# Patient Record
Sex: Male | Born: 1990
Health system: Southern US, Community
[De-identification: ages and names within clinical notes are randomized; demographics above are authoritative.]

## PROBLEM LIST (undated history)

## (undated) DIAGNOSIS — F419 Anxiety disorder, unspecified: Secondary | ICD-10-CM

## (undated) DIAGNOSIS — E785 Hyperlipidemia, unspecified: Secondary | ICD-10-CM

## (undated) HISTORY — PX: OTHER SURGICAL HISTORY: SHX169

## (undated) HISTORY — DX: Hyperlipidemia, unspecified: E78.5

## (undated) HISTORY — DX: Anxiety disorder, unspecified: F41.9

---

## 2016-09-10 ENCOUNTER — Encounter: Payer: Self-pay | Admitting: Family Medicine

## 2018-04-26 ENCOUNTER — Encounter: Payer: Self-pay | Admitting: Family Medicine

## 2019-04-06 ENCOUNTER — Ambulatory Visit: Payer: BC Managed Care – PPO | Admitting: Family Medicine

## 2019-04-06 ENCOUNTER — Telehealth: Payer: Self-pay | Admitting: Family Medicine

## 2019-04-06 ENCOUNTER — Encounter: Payer: Self-pay | Admitting: Family Medicine

## 2019-04-06 ENCOUNTER — Other Ambulatory Visit: Payer: Self-pay

## 2019-04-06 VITALS — BP 130/90 | HR 88 | Temp 96.7°F | Resp 12 | Ht 72.5 in | Wt 224.2 lb

## 2019-04-06 DIAGNOSIS — E538 Deficiency of other specified B group vitamins: Secondary | ICD-10-CM

## 2019-04-06 DIAGNOSIS — R5382 Chronic fatigue, unspecified: Secondary | ICD-10-CM

## 2019-04-06 DIAGNOSIS — E611 Iron deficiency: Secondary | ICD-10-CM | POA: Diagnosis not present

## 2019-04-06 DIAGNOSIS — R7989 Other specified abnormal findings of blood chemistry: Secondary | ICD-10-CM

## 2019-04-06 DIAGNOSIS — E559 Vitamin D deficiency, unspecified: Secondary | ICD-10-CM

## 2019-04-06 DIAGNOSIS — E785 Hyperlipidemia, unspecified: Secondary | ICD-10-CM

## 2019-04-06 DIAGNOSIS — R030 Elevated blood-pressure reading, without diagnosis of hypertension: Secondary | ICD-10-CM

## 2019-04-06 NOTE — Patient Instructions (Signed)
A few things to remember from today's visit:   Chronic fatigue  Iron deficiency  B12 deficiency  Low testosterone in male   Medication I asked her to discontinue today was aspirated. Rest of the medication we will reevaluate when to have labs done in 12/2018.  It is a common symptom associated with multiple factors: psychologic,medications, systemic illness, sleep disorders,infections, and unknown causes. Some work-up can be done to evaluate for common causes as thyroid disease,anemia,diabetes, or abnormalities in calcium,potassium,or sodium. Regular physical activity as tolerated and a healthy diet is usually might help and usually recommended for chronic fatigue.   Please be sure medication list is accurate. If a new problem present, please set up appointment sooner than planned today.

## 2019-04-06 NOTE — Telephone Encounter (Signed)
There are no lab orders.

## 2019-04-06 NOTE — Telephone Encounter (Addendum)
Relation to pt: self  Call back number: [phone]   Reason for call:  Patient had a New Patient appointment today and was inquiring about lab orders seeking to schedule appointment tomorrow, please advise.   Patient will have medical records fax to [phone] please note when received

## 2019-04-06 NOTE — Telephone Encounter (Signed)
Please advise 

## 2019-04-06 NOTE — Progress Notes (Signed)
HPI:   Dalton Rogers is a 28 y.o. male, who is here today to establish care.  Former PCP: Dr Christoper Fabian He moved from [place] in 01/2019. He lives with his wife and 8-9 months son.  Last preventive routine visit: 12/2018  Chronic medical problems: Vit D def,iron def,B12 def,fatigue,anxiety,hiatal hernia,and HLD among some.  HLD: He is on Atorvastatin 20 mg daily. Last FLP done in 12/2018. He also takes Aspirin 81 mg for primary care prevention.  BP mildly elevated today. No Hx of HTN. Denies severe/frequent headache, visual changes, chest pain, dyspnea, palpitation, focal weakness, or edema.  Significant FHx of CVD.  He has a coronary calcium score 0.  B12 deficiency,he takes B12 500 mcg daily. He has not noted numbness,tingling,weaknesss,or MS changes.  Vit D deficiency:He takes Vit D3 5000 U daily.  Anxiety, he is on Paxil 20 mg,which has helped with symptoms. He has taken medication for about 10 years. He denies depressed mood but he has sadness and crying spells when he tried to discontinue Paxil in 2015. He was following with psychiatrist annually.  Today he is c/o fatigue,hungry "all the time" and wt gain.  He has had fatigue since 2015. Reporting Hx of low testosterone. Denies ED or decreased libido.  He has not been treated.  Sleeps well in general, 7-8 hours. His son wakes up about 1-2 times thought the night.  No known Hx of OSA.  Concerns today: Medications refill.  Review of Systems  Constitutional: Negative for activity change, appetite change, fever and unexpected weight change.  HENT: Negative for nosebleeds, sore throat and trouble swallowing.   Eyes: Negative for pain and redness.  Respiratory: Negative for cough and wheezing.   Gastrointestinal: Negative for abdominal pain, nausea and vomiting.  Endocrine: Negative for cold intolerance, heat intolerance, polydipsia, polyphagia and polyuria.  Genitourinary: Negative for decreased urine  volume, dysuria and hematuria.  Musculoskeletal: Negative for arthralgias, gait problem and myalgias.  Skin: Negative for pallor and rash.  Allergic/Immunologic: Negative for environmental allergies.  Neurological: Negative for syncope and numbness.  Psychiatric/Behavioral: The patient is nervous/anxious.   Rest see pertinent positives and negatives per HPI.   Current Outpatient Medications on File Prior to Visit  Medication Sig Dispense Refill  . atorvastatin (LIPITOR) 20 MG tablet Take 20 mg by mouth daily.    . Cholecalciferol (VITAMIN D3) 125 MCG (5000 UT) CAPS Take by mouth.    . Fe Fum-FePoly-FA-Vit C-Vit B3 (INTEGRA F PO) Take 62.5 mg by mouth.    . Ferrous Sulfate Dried 45 MG TBCR Take by mouth every other day.    Marland Kitchen PARoxetine (PAXIL) 20 MG tablet Take 20 mg by mouth daily.    . vitamin B-12 (CYANOCOBALAMIN) 500 MCG tablet Take 500 mcg by mouth daily.     No current facility-administered medications on file prior to visit.      Past Medical History:  Diagnosis Date  . Anxiety   . Hyperlipidemia    No Known Allergies  Family History  Problem Relation Age of Onset  . Heart disease Father   . Hyperlipidemia Father   . Hypertension Father     Social History   Socioeconomic History  . Marital status: Married    Spouse name: Not on file  . Number of children: Not on file  . Years of education: Not on file  . Highest education level: Not on file  Occupational History  . Not on file  Social Needs  . Financial  resource strain: Not on file  . Food insecurity    Worry: Not on file    Inability: Not on file  . Transportation needs    Medical: Not on file    Non-medical: Not on file  Tobacco Use  . Smoking status: Never Smoker  . Smokeless tobacco: Never Used  Substance and Sexual Activity  . Alcohol use: Yes    Comment: ocassionally  . Drug use: Not Currently  . Sexual activity: Yes  Lifestyle  . Physical activity    Days per week: Not on file    Minutes  per session: Not on file  . Stress: Not on file  Relationships  . Social Musician on phone: Not on file    Gets together: Not on file    Attends religious service: Not on file    Active member of club or organization: Not on file    Attends meetings of clubs or organizations: Not on file    Relationship status: Not on file  Other Topics Concern  . Not on file  Social History Narrative  . Not on file    Vitals:   04/06/19 0827  BP: 130/90  Pulse: 88  Resp: 12  Temp: (!) 96.7 F (35.9 C)  SpO2: 97%    Body mass index is 29.99 kg/m.   Physical Exam  Nursing note and vitals reviewed. Constitutional: He is oriented to person, place, and time. He appears well-developed. No distress.  HENT:  Head: Normocephalic and atraumatic.  Mouth/Throat: Oropharynx is clear and moist and mucous membranes are normal.  Eyes: Pupils are equal, round, and reactive to light. Conjunctivae are normal.  Neck: No tracheal deviation present. No thyromegaly present.  Cardiovascular: Normal rate and regular rhythm.  No murmur heard. Pulses:      Dorsalis pedis pulses are 2+ on the right side and 2+ on the left side.  Respiratory: Effort normal and breath sounds normal. No respiratory distress.  GI: Soft. He exhibits no mass. There is no hepatomegaly. There is no abdominal tenderness.  Musculoskeletal:        General: No edema.  Lymphadenopathy:    He has no cervical adenopathy.  Neurological: He is alert and oriented to person, place, and time. He has normal strength. No cranial nerve deficit. Gait normal.  Skin: Skin is warm. No rash noted. No erythema.  Psychiatric: He has a normal mood and affect.  Well groomed, good eye contact.    ASSESSMENT AND PLAN:  [name] was seen today for establish care.  Diagnoses and all orders for this visit:  Chronic fatigue We discussed possible etiologies: Systemic illness, immunologic,endocrinology,sleep disorder,  psychiatric/psychologic, infectious,medications side effects, and idiopathic. Examination today does not suggest a serious process. Healthy diet and regular physical activity may help.  -     TSH; Future  Iron deficiency According to pt,unknown etiology. Continue Iron supplementation.  -     TSH; Future -     CBC; Future -     Ferritin; Future  B12 deficiency Continue B12 500 mcg daily. B12 will be adjusted if necessary and according to B12 results.  -     CBC; Future -     Vitamin B12; Future  Low testosterone in male Not on treatment. Educated about criteria dx and treatment options.  -     Testosterone; Future  Elevated blood pressure reading Mildly elevated BP today. Instructed to monitor BP regularly.  Vitamin D deficiency, unspecified Continue Vit  D supplementation.  -     VITAMIN D 25 Hydroxy (Vit-D Deficiency, Fractures); Future  Hyperlipidemia, unspecified hyperlipidemia type Continue Atorvastatin 20 mg daily and low fat diet. Will obtain copy of FLP results.  At the time of the visit I do not have results of labs done in 12/2018. We decided to hold on blood work until I receive copy of lab results,so we can determine what we need to follow on. Apparently he asked for lab appt when checking out,so future labs placed.   Return in about 2 months (around 06/06/2019).    Chancey Ringel G. [place], MD  [facility]. [place] office.

## 2019-04-06 NOTE — Telephone Encounter (Signed)
Discussed about checking testosterone but I thought we agreed on waiting for results of labs done recently by his former PCP and then decide what to order. Thanks, BJ

## 2019-04-08 ENCOUNTER — Encounter: Payer: Self-pay | Admitting: *Deleted

## 2019-04-08 NOTE — Telephone Encounter (Signed)
Patient informed. 

## 2019-06-06 ENCOUNTER — Ambulatory Visit: Payer: BC Managed Care – PPO | Admitting: Family Medicine

## 2019-06-27 ENCOUNTER — Ambulatory Visit: Payer: BC Managed Care – PPO | Admitting: Family Medicine

## 2019-07-19 ENCOUNTER — Ambulatory Visit: Payer: BC Managed Care – PPO | Admitting: Family Medicine

## 2019-07-19 ENCOUNTER — Encounter: Payer: Self-pay | Admitting: Family Medicine

## 2019-07-19 ENCOUNTER — Other Ambulatory Visit: Payer: Self-pay

## 2019-07-19 VITALS — BP 128/80 | HR 84 | Temp 95.1°F | Resp 12 | Ht 72.5 in | Wt 218.0 lb

## 2019-07-19 DIAGNOSIS — E559 Vitamin D deficiency, unspecified: Secondary | ICD-10-CM | POA: Diagnosis not present

## 2019-07-19 DIAGNOSIS — E785 Hyperlipidemia, unspecified: Secondary | ICD-10-CM

## 2019-07-19 DIAGNOSIS — R5382 Chronic fatigue, unspecified: Secondary | ICD-10-CM

## 2019-07-19 DIAGNOSIS — Z23 Encounter for immunization: Secondary | ICD-10-CM | POA: Diagnosis not present

## 2019-07-19 DIAGNOSIS — E663 Overweight: Secondary | ICD-10-CM

## 2019-07-19 DIAGNOSIS — E611 Iron deficiency: Secondary | ICD-10-CM | POA: Diagnosis not present

## 2019-07-19 LAB — CBC
HCT: 44.9 % (ref 39.0–52.0)
Hemoglobin: 14.9 g/dL (ref 13.0–17.0)
MCHC: 33.3 g/dL (ref 30.0–36.0)
MCV: 91.7 fl (ref 78.0–100.0)
Platelets: 280 10*3/uL (ref 150.0–400.0)
RBC: 4.89 Mil/uL (ref 4.22–5.81)
RDW: 12.8 % (ref 11.5–15.5)
WBC: 6.6 10*3/uL (ref 4.0–10.5)

## 2019-07-19 LAB — LIPID PANEL
Cholesterol: 158 mg/dL (ref 0–200)
HDL: 39.8 mg/dL (ref >39.00)
LDL Cholesterol: 97 mg/dL (ref 0–99)
NonHDL: 118.04
Total CHOL/HDL Ratio: 4
Triglycerides: 105 mg/dL (ref 0.0–149.0)
VLDL: 21 mg/dL (ref 0.0–40.0)

## 2019-07-19 LAB — VITAMIN D 25 HYDROXY (VIT D DEFICIENCY, FRACTURES): VITD: 45.45 ng/mL (ref 30.00–100.00)

## 2019-07-19 LAB — IRON: Iron: 102 ug/dL (ref 42–165)

## 2019-07-19 LAB — FERRITIN: Ferritin: 42 ng/mL (ref 22.0–322.0)

## 2019-07-19 MED ORDER — ATORVASTATIN CALCIUM 20 MG PO TABS: 20.0000 mg | ORAL_TABLET | Freq: Every day | ORAL | 2 refills | Status: DC

## 2019-07-19 NOTE — Patient Instructions (Signed)
A few things to remember from today's visit:   Iron deficiency - Plan: Iron, Ferritin, CBC  Vitamin D deficiency, unspecified - Plan: VITAMIN D 25 Hydroxy (Vit-D Deficiency, Fractures)  Hyperlipidemia, unspecified hyperlipidemia type - Plan: Lipid panel  No changes today. Recommendations in regard to iron and vit D supplementation will be given according to lab results.  Please be sure medication list is accurate. If a new problem present, please set up appointment sooner than planned today.

## 2019-07-19 NOTE — Assessment & Plan Note (Signed)
Great improvement with dietary changes. No further work up at this time.

## 2019-07-19 NOTE — Assessment & Plan Note (Signed)
No changes in current management, will follow CBC/iron done today and will give further recommendations accordingly.

## 2019-07-19 NOTE — Assessment & Plan Note (Signed)
No changes in current management, will follow labs done today and will give further recommendations accordingly. We discussed benefits of stain meds as well as side effects.

## 2019-07-19 NOTE — Assessment & Plan Note (Signed)
He will try to be more compliant with taking Vit D 5000 U daily. Further recommendations will be given according to 25 [place] vit D results.

## 2019-07-19 NOTE — Assessment & Plan Note (Signed)
Since his last visit he has lost about 6 Lb since his last OV. Encouraged to follow a healthful diet. Regular physical activity recommended.

## 2019-07-19 NOTE — Progress Notes (Signed)
HPI:   DaltonDalton Rogers is a 28 y.o. male, who is here today for 4 months follow up.   He was last seen on 04/06/19.  Vit D deficiency: He is on Vit D 5000 U, he is not taking med daily.  HLD: He is on Atorvastatin 20 mg daily. Last FLP in 12/2018. Strong FHx of CVD.  Tolerating medication well. Denies chest pain, dyspnea, palpitation, claudication, focal weakness, or edema.  Fatigue has improved with dietary changes he made since his last visit. He is now vegan and feels more energetic. He is not exercising regularly.  Iron deficiency: He stopped one of his iron supplementation. Currently he is on Integra F PO once daily. He has not noted blood in stool or melena.  Review of Systems  Constitutional: Negative for activity change, appetite change, fatigue, fever and unexpected weight change.  HENT: Negative for nosebleeds, sore throat and trouble swallowing.   Eyes: Negative for redness.  Respiratory: Negative for cough and wheezing.   Gastrointestinal: Negative for abdominal pain, nausea and vomiting.  Genitourinary: Negative for decreased urine volume and hematuria.  Musculoskeletal: Negative for arthralgias and myalgias.  Neurological: Negative for syncope, weakness and headaches.  Rest of ROS, see pertinent positives sand negatives in HPI   Current Outpatient Medications on File Prior to Visit  Medication Sig Dispense Refill  . atorvastatin (LIPITOR) 20 MG tablet Take 20 mg by mouth daily.    . Cholecalciferol (VITAMIN D3) 125 MCG (5000 UT) CAPS Take by mouth.    . Fe Fum-FePoly-FA-Vit C-Vit B3 (INTEGRA F PO) Take 62.5 mg by mouth.    Marland Kitchen PARoxetine (PAXIL) 20 MG tablet Take 20 mg by mouth daily.    . vitamin B-12 (CYANOCOBALAMIN) 500 MCG tablet Take 500 mcg by mouth daily.     No current facility-administered medications on file prior to visit.      Past Medical History:  Diagnosis Date  . Anxiety   . Hyperlipidemia    No Known Allergies  Social History    Socioeconomic History  . Marital status: Married    Spouse name: Not on file  . Number of children: Not on file  . Years of education: Not on file  . Highest education level: Not on file  Occupational History  . Not on file  Social Needs  . Financial resource strain: Not on file  . Food insecurity    Worry: Not on file    Inability: Not on file  . Transportation needs    Medical: Not on file    Non-medical: Not on file  Tobacco Use  . Smoking status: Never Smoker  . Smokeless tobacco: Never Used  Substance and Sexual Activity  . Alcohol use: Yes    Comment: ocassionally  . Drug use: Not Currently  . Sexual activity: Yes  Lifestyle  . Physical activity    Days per week: Not on file    Minutes per session: Not on file  . Stress: Not on file  Relationships  . Social Musician on phone: Not on file    Gets together: Not on file    Attends religious service: Not on file    Active member of club or organization: Not on file    Attends meetings of clubs or organizations: Not on file    Relationship status: Not on file  Other Topics Concern  . Not on file  Social History Narrative  . Not on file  Vitals:   07/19/19 0753  BP: 128/80  Pulse: 84  Resp: 12  Temp: (!) 95.1 F (35.1 C)  SpO2: 97%   Body mass index is 29.16 kg/m. Wt Readings from Last 3 Encounters:  07/19/19 218 lb (98.9 kg)  04/06/19 224 lb 3.2 oz (101.7 kg)    Physical Exam  Nursing note and vitals reviewed. Constitutional: He is oriented to person, place, and time. He appears well-developed. No distress.  HENT:  Head: Normocephalic and atraumatic.  Mouth/Throat: Oropharynx is clear and moist and mucous membranes are normal.  Eyes: Pupils are equal, round, and reactive to light. Conjunctivae are normal.  Cardiovascular: Normal rate and regular rhythm.  No murmur heard. Pulses:      Dorsalis pedis pulses are 2+ on the right side and 2+ on the left side.  Respiratory: Effort  normal and breath sounds normal. No respiratory distress.  GI: Soft. He exhibits no mass. There is no hepatomegaly. There is no abdominal tenderness.  Musculoskeletal:        General: No edema.  Lymphadenopathy:    He has no cervical adenopathy.  Neurological: He is alert and oriented to person, place, and time. He has normal strength. No cranial nerve deficit. Gait normal.  Skin: Skin is warm. No rash noted. No erythema.  Psychiatric: He has a normal mood and affect.  Well groomed, good eye contact.   ASSESSMENT AND PLAN:   Dalton Rogers was seen today for 4 months follow-up.  Orders Placed This Encounter  Procedures  . Flu Vaccine QUAD 6+ mos PF IM (Fluarix Quad PF)  . VITAMIN D 25 Hydroxy (Vit-D Deficiency, Fractures)  . Iron  . Ferritin  . Lipid panel  . CBC   Lab Results  Component Value Date   CHOL 158 07/19/2019   HDL 39.80 07/19/2019   LDLCALC 97 07/19/2019   TRIG 105.0 07/19/2019   CHOLHDL 4 07/19/2019    Lab Results  Component Value Date   WBC 6.6 07/19/2019   HGB 14.9 07/19/2019   HCT 44.9 07/19/2019   MCV 91.7 07/19/2019   PLT 280.0 07/19/2019    Chronic fatigue Great improvement with dietary changes. No further work up at this time.   Hyperlipidemia, unspecified No changes in current management, will follow labs done today and will give further recommendations accordingly. We discussed benefits of stain meds as well as side effects.  Iron deficiency No changes in current management, will follow CBC/iron done today and will give further recommendations accordingly.   Overweight (BMI 25.0-29.9) Since his last visit he has lost about 6 Lb since his last OV. Encouraged to follow a healthful diet. Regular physical activity recommended.   Vitamin D deficiency, unspecified He will try to be more compliant with taking Vit D 5000 U daily. Further recommendations will be given according to 25 OH vit D results.  Need for influenza vaccination -  Flu Vaccine QUAD 6+ mos PF IM (Fluarix Quad PF)   Return in about 7 months (around 02/15/2020) for cpe.   Destane Speas G. [place], MD  [facility]. [place] office.

## 2019-07-20 MED ORDER — PAROXETINE HCL 20 MG PO TABS: 20.0000 mg | ORAL_TABLET | Freq: Every day | ORAL | 1 refills | Status: DC

## 2020-01-02 ENCOUNTER — Telehealth: Payer: Self-pay | Admitting: *Deleted

## 2020-01-02 NOTE — Telephone Encounter (Signed)
Patient requests refill of Folivane-F capsule

## 2020-01-03 NOTE — Telephone Encounter (Signed)
It is Ok to send refill. Thanks, BJ

## 2020-01-04 ENCOUNTER — Other Ambulatory Visit: Payer: Self-pay | Admitting: *Deleted

## 2020-01-04 MED ORDER — FOLIVANE-F 125-1 MG PO CAPS: 1.0000 | ORAL_CAPSULE | Freq: Every day | ORAL | 3 refills | Status: DC

## 2020-01-04 NOTE — Telephone Encounter (Signed)
Rx sent to the pharmacy.

## 2020-01-06 ENCOUNTER — Other Ambulatory Visit: Payer: Self-pay | Admitting: *Deleted

## 2020-01-06 MED ORDER — FOLIVANE-F 125-1 MG PO CAPS: 1.0000 | ORAL_CAPSULE | Freq: Every day | ORAL | 1 refills | Status: DC

## 2020-01-15 ENCOUNTER — Other Ambulatory Visit: Payer: Self-pay | Admitting: Family Medicine

## 2020-02-17 ENCOUNTER — Encounter: Payer: Self-pay | Admitting: Family Medicine

## 2020-04-18 ENCOUNTER — Other Ambulatory Visit: Payer: Self-pay | Admitting: Family Medicine

## 2020-04-18 DIAGNOSIS — E785 Hyperlipidemia, unspecified: Secondary | ICD-10-CM

## 2020-07-27 ENCOUNTER — Other Ambulatory Visit: Payer: Self-pay | Admitting: Family Medicine

## 2020-08-24 ENCOUNTER — Encounter: Payer: Self-pay | Admitting: Family Medicine

## 2020-08-24 ENCOUNTER — Telehealth (INDEPENDENT_AMBULATORY_CARE_PROVIDER_SITE_OTHER): Payer: No Typology Code available for payment source | Admitting: Family Medicine

## 2020-08-24 DIAGNOSIS — U071 COVID-19: Secondary | ICD-10-CM | POA: Insufficient documentation

## 2020-08-24 NOTE — Progress Notes (Signed)
   Subjective:    Patient ID: Dalton Rogers, male    DOB: Nov 23, 1990, 30 y.o.   MRN: [id number]  HPI Virtual Visit via Video Note  I connected with the patient on 08/24/20 at  3:30 PM EST by a video enabled telemedicine application and verified that I am speaking with the correct person using two identifiers.  Location patient: home Location provider:work or home office Persons participating in the virtual visit: patient, provider  I discussed the limitations of evaluation and management by telemedicine and the availability of in person appointments. The patient expressed understanding and agreed to proceed.   HPI: Here for a Covid-19 infection. He started feeling sick about 5 days ago. He has had chills, sweats (though no fever), body aches, and a dry cough. No NVD. His wifehas had similar symptoms. He tested positive for the Covid virus 2 days ago, while she has tested negative. Today he feels much better. He has been drinking fluids and taking Aleve.    ROS: See pertinent positives and negatives per HPI.  Past Medical History:  Diagnosis Date  . Anxiety   . Hyperlipidemia     History reviewed. No pertinent surgical history.  Family History  Problem Relation Age of Onset  . Heart disease Father   . Hyperlipidemia Father   . Hypertension Father      Current Outpatient Medications:  .  atorvastatin (LIPITOR) 20 MG tablet, TAKE 1 TABLET BY MOUTH EVERY DAY, Disp: 90 tablet, Rfl: 2 .  Cholecalciferol (VITAMIN D3) 125 MCG (5000 UT) CAPS, Take by mouth., Disp: , Rfl:  .  Fe Fum-FePoly-FA-Vit C-Vit B3 (FOLIVANE-F) 125-1 MG CAPS, Take 1 capsule by mouth daily., Disp: 90 capsule, Rfl: 1 .  PARoxetine (PAXIL) 20 MG tablet, TAKE 1 TABLET BY MOUTH EVERY DAY, Disp: 90 tablet, Rfl: 1 .  vitamin B-12 (CYANOCOBALAMIN) 500 MCG tablet, Take 500 mcg by mouth daily., Disp: , Rfl:   EXAM:  VITALS per patient if applicable:  GENERAL: alert, oriented, appears well and in no acute  distress  HEENT: atraumatic, conjunttiva clear, no obvious abnormalities on inspection of external nose and ears  NECK: normal movements of the head and neck  LUNGS: on inspection no signs of respiratory distress, breathing rate appears normal, no obvious gross SOB, gasping or wheezing  CV: no obvious cyanosis  MS: moves all visible extremities without noticeable abnormality  PSYCH/NEURO: pleasant and cooperative, no obvious depression or anxiety, speech and thought processing grossly intact  ASSESSMENT AND PLAN: Covid-19 infection. He will quarantine at home for a total of 10 days. He can take Delsym as needed for the cough. Follow up prn.  Gershon Crane, MD  Discussed the following assessment and plan:  No diagnosis found.     I discussed the assessment and treatment plan with the patient. The patient was provided an opportunity to ask questions and all were answered. The patient agreed with the plan and demonstrated an understanding of the instructions.   The patient was advised to call back or seek an in-person evaluation if the symptoms worsen or if the condition fails to improve as anticipated.     Review of Systems     Objective:   Physical Exam        Assessment & Plan:

## 2020-10-10 ENCOUNTER — Telehealth (INDEPENDENT_AMBULATORY_CARE_PROVIDER_SITE_OTHER): Payer: No Typology Code available for payment source | Admitting: Family Medicine

## 2020-10-10 ENCOUNTER — Encounter: Payer: Self-pay | Admitting: Family Medicine

## 2020-10-10 VITALS — Ht 72.5 in

## 2020-10-10 DIAGNOSIS — E785 Hyperlipidemia, unspecified: Secondary | ICD-10-CM

## 2020-10-10 DIAGNOSIS — E611 Iron deficiency: Secondary | ICD-10-CM

## 2020-10-10 DIAGNOSIS — R002 Palpitations: Secondary | ICD-10-CM | POA: Diagnosis not present

## 2020-10-10 DIAGNOSIS — Z1159 Encounter for screening for other viral diseases: Secondary | ICD-10-CM | POA: Diagnosis not present

## 2020-10-10 DIAGNOSIS — F419 Anxiety disorder, unspecified: Secondary | ICD-10-CM

## 2020-10-10 NOTE — Progress Notes (Signed)
Virtual Visit via Video Note I connected with Dalton Rogers on 10/10/20 by a video enabled telemedicine application and verified that I am speaking with the correct person using two identifiers.  Location patient: home Location provider:work office Persons participating in the virtual visit: patient, provider  I discussed the limitations of evaluation and management by telemedicine and the availability of in person appointments. The patient expressed understanding and agreed to proceed.  Chief Complaint  Patient presents with  . Palpitations   HPI: Dalton Rogers is a 30 yo male with hx of HLD, anxiety,and iron ef anemia c/o 7 days of intermittent episodes of palpitations. This is a new problem. It happens most of the times when he is resting, sometimes when walking. Problem does not wake him up/it does not interfere with sleep.  No associated fever,abnormal wt loss,CP,SOB,abdominal pain,N/V,dizziness,syncope,or diaphoresis. Episodes happened every 5 minutes, for the past 24 hours it is every 10 min. Each episode last 1-2 seconds.  He has checked HR during episodes, usually 70-90/min, regular. He has not identified exacerbating, although it seems to be worse with caffeine intake. He has not identified alleviating factors.  No new medications or stressors. Anxiety has been stable. He is on Paroxetine 20 mg daily. Negative for depression.  HLD: He is on Atorvastatin 20 mg daily. He has tolerated medication well.  Lab Results  Component Value Date   CHOL 158 07/19/2019   HDL 39.80 07/19/2019   LDLCALC 97 07/19/2019   TRIG 105.0 07/19/2019   CHOLHDL 4 07/19/2019   Iron deficiency: He stopped iron supplementation a few days ago. He has not noted melena or blood in stool.  Lab Results  Component Value Date   WBC 6.6 07/19/2019   HGB 14.9 07/19/2019   HCT 44.9 07/19/2019   MCV 91.7 07/19/2019   PLT 280.0 07/19/2019   ROS: See pertinent positives and negatives per HPI.  Past  Medical History:  Diagnosis Date  . Anxiety   . Hyperlipidemia    History reviewed. No pertinent surgical history.  Family History  Problem Relation Age of Onset  . Heart disease Father   . Hyperlipidemia Father   . Hypertension Father     Social History   Socioeconomic History  . Marital status: Married    Spouse name: Not on file  . Number of children: Not on file  . Years of education: Not on file  . Highest education level: Not on file  Occupational History  . Not on file  Tobacco Use  . Smoking status: Never Smoker  . Smokeless tobacco: Never Used  Substance and Sexual Activity  . Alcohol use: Yes    Comment: ocassionally  . Drug use: Not Currently  . Sexual activity: Yes  Other Topics Concern  . Not on file  Social History Narrative  . Not on file   Social Determinants of Health   Financial Resource Strain: Not on file  Food Insecurity: Not on file  Transportation Needs: Not on file  Physical Activity: Not on file  Stress: Not on file  Social Connections: Not on file  Intimate Partner Violence: Not on file    Current Outpatient Medications:  .  atorvastatin (LIPITOR) 20 MG tablet, TAKE 1 TABLET BY MOUTH EVERY DAY, Disp: 90 tablet, Rfl: 2 .  Cholecalciferol (VITAMIN D3) 125 MCG (5000 UT) CAPS, Take by mouth., Disp: , Rfl:  .  Fe Fum-FePoly-FA-Vit C-Vit B3 (FOLIVANE-F) 125-1 MG CAPS, Take 1 capsule by mouth daily., Disp: 90 capsule, Rfl: 1 .  PARoxetine (PAXIL) 20 MG tablet, TAKE 1 TABLET BY MOUTH EVERY DAY, Disp: 90 tablet, Rfl: 1 .  vitamin B-12 (CYANOCOBALAMIN) 500 MCG tablet, Take 500 mcg by mouth daily., Disp: , Rfl:   EXAM:  VITALS per patient if applicable:Ht 6' 0.5" (1.842 m)   BMI 29.16 kg/m   GENERAL: alert, oriented, appears well and in no acute distress  HEENT: atraumatic, conjunctiva clear, no obvious abnormalities on inspection.  NECK: normal movements of the head and neck  LUNGS: on inspection no signs of respiratory distress,  breathing rate appears normal, no obvious gross SOB, gasping or wheezing  CV: no obvious cyanosis  MS: moves all visible extremities without noticeable abnormality  PSYCH/NEURO: pleasant and cooperative, no obvious depression or anxiety, speech and thought processing grossly intact  ASSESSMENT AND PLAN:  Discussed the following assessment and plan:  Heart palpitations - Plan: CBC, TSH, EKG 12-Lead We discussed possible etiologies. Hx does not suggest a serious process. Recommend stopping caffeine for a few weeks and monitor for changes. Adequate hydration. Instructed about warning signs. Lab appt and EKG will be arranged.  Iron deficiency - Plan: CBC, Iron, Ferritin Planning on resuming iron supplementation. Further recommendations according to CBC and iron results.  Hyperlipidemia, unspecified hyperlipidemia type - Plan: Comprehensive metabolic panel, Lipid panel Continue Atorvastatin 20 mg daily.  Encounter for HCV screening test for low risk patient - Plan: Hepatitis C antibody  Anxiety disorder, unspecified type Problem seems to be well controlled. Continue Paroxetine 20 mg daily.  We discussed possible serious and likely etiologies, options for evaluation and workup, limitations of telemedicine visit vs in person visit, treatment, treatment risks and precautions.  I discussed the assessment and treatment plan with the patient. Dalton Rogers was provided an opportunity to ask questions and all were answered. He agreed with the plan and demonstrated an understanding of the instructions.  Return in about 5 days (around 10/15/2020) for labs and EKG.  Jaylaa Gallion [place], MD

## 2020-10-15 ENCOUNTER — Other Ambulatory Visit: Payer: Self-pay

## 2020-10-15 ENCOUNTER — Other Ambulatory Visit (INDEPENDENT_AMBULATORY_CARE_PROVIDER_SITE_OTHER): Payer: No Typology Code available for payment source

## 2020-10-15 DIAGNOSIS — E611 Iron deficiency: Secondary | ICD-10-CM | POA: Diagnosis not present

## 2020-10-15 DIAGNOSIS — E785 Hyperlipidemia, unspecified: Secondary | ICD-10-CM

## 2020-10-15 DIAGNOSIS — R002 Palpitations: Secondary | ICD-10-CM | POA: Diagnosis not present

## 2020-10-15 DIAGNOSIS — Z1159 Encounter for screening for other viral diseases: Secondary | ICD-10-CM | POA: Diagnosis not present

## 2020-10-15 LAB — CBC
HCT: 44 % (ref 39.0–52.0)
Hemoglobin: 15 g/dL (ref 13.0–17.0)
MCHC: 34 g/dL (ref 30.0–36.0)
MCV: 90.3 fl (ref 78.0–100.0)
Platelets: 293 10*3/uL (ref 150.0–400.0)
RBC: 4.87 Mil/uL (ref 4.22–5.81)
RDW: 13.2 % (ref 11.5–15.5)
WBC: 6 10*3/uL (ref 4.0–10.5)

## 2020-10-15 LAB — COMPREHENSIVE METABOLIC PANEL
ALT: 35 U/L (ref 0–53)
AST: 21 U/L (ref 0–37)
Albumin: 4.6 g/dL (ref 3.5–5.2)
Alkaline Phosphatase: 81 U/L (ref 39–117)
BUN: 10 mg/dL (ref 6–23)
CO2: 30 mEq/L (ref 19–32)
Calcium: 9.8 mg/dL (ref 8.4–10.5)
Chloride: 104 mEq/L (ref 96–112)
Creatinine, Ser: 0.99 mg/dL (ref 0.40–1.50)
GFR: 102.42 mL/min (ref >60.00)
Glucose, Bld: 90 mg/dL (ref 70–99)
Potassium: 4 mEq/L (ref 3.5–5.1)
Sodium: 141 mEq/L (ref 135–145)
Total Bilirubin: 0.4 mg/dL (ref 0.2–1.2)
Total Protein: 6.9 g/dL (ref 6.0–8.3)

## 2020-10-15 LAB — TSH: TSH: 1.97 u[IU]/mL (ref 0.35–4.50)

## 2020-10-15 LAB — LIPID PANEL
Cholesterol: 165 mg/dL (ref 0–200)
HDL: 44.5 mg/dL (ref >39.00)
LDL Cholesterol: 91 mg/dL (ref 0–99)
NonHDL: 120.5
Total CHOL/HDL Ratio: 4
Triglycerides: 146 mg/dL (ref 0.0–149.0)
VLDL: 29.2 mg/dL (ref 0.0–40.0)

## 2020-10-15 LAB — IRON: Iron: 95 ug/dL (ref 42–165)

## 2020-10-15 LAB — FERRITIN: Ferritin: 34.5 ng/mL (ref 22.0–322.0)

## 2020-10-16 LAB — HEPATITIS C ANTIBODY
Hepatitis C Ab: NONREACTIVE
SIGNAL TO CUT-OFF: 0.01 (ref <1.00)

## 2021-04-10 ENCOUNTER — Encounter: Payer: Self-pay | Admitting: Family Medicine

## 2021-04-10 DIAGNOSIS — E785 Hyperlipidemia, unspecified: Secondary | ICD-10-CM

## 2021-04-12 MED ORDER — ATORVASTATIN CALCIUM 20 MG PO TABS: 20.0000 mg | ORAL_TABLET | Freq: Every day | ORAL | 1 refills | Status: DC

## 2021-07-09 ENCOUNTER — Other Ambulatory Visit: Payer: Self-pay

## 2021-07-09 ENCOUNTER — Encounter (HOSPITAL_BASED_OUTPATIENT_CLINIC_OR_DEPARTMENT_OTHER): Payer: Self-pay

## 2021-07-09 DIAGNOSIS — Z5321 Procedure and treatment not carried out due to patient leaving prior to being seen by health care provider: Secondary | ICD-10-CM | POA: Insufficient documentation

## 2021-07-09 DIAGNOSIS — K219 Gastro-esophageal reflux disease without esophagitis: Secondary | ICD-10-CM | POA: Insufficient documentation

## 2021-07-09 DIAGNOSIS — R1110 Vomiting, unspecified: Secondary | ICD-10-CM | POA: Diagnosis present

## 2021-07-09 NOTE — ED Triage Notes (Signed)
Thinks he may a bolus of good in his esophagus. States he had chicken for dinner.

## 2021-07-10 ENCOUNTER — Emergency Department (HOSPITAL_BASED_OUTPATIENT_CLINIC_OR_DEPARTMENT_OTHER)
Admission: EM | Admit: 2021-07-10 | Discharge: 2021-07-10 | Disposition: A | Discharge status: Home / Self Care | Payer: No Typology Code available for payment source | Attending: Emergency Medicine | Admitting: Emergency Medicine

## 2021-07-10 NOTE — ED Notes (Signed)
RN out in lobby, pt says he is feeling better and is leaving. Advised he is free to return if he changes his mind at any time.

## 2021-07-11 ENCOUNTER — Encounter: Payer: Self-pay | Admitting: Family Medicine

## 2021-07-11 ENCOUNTER — Ambulatory Visit: Payer: No Typology Code available for payment source | Admitting: Family Medicine

## 2021-07-11 VITALS — BP 118/84 | HR 80 | Temp 98.4°F | Wt 219.6 lb

## 2021-07-11 DIAGNOSIS — R1310 Dysphagia, unspecified: Secondary | ICD-10-CM | POA: Diagnosis not present

## 2021-07-11 DIAGNOSIS — K449 Diaphragmatic hernia without obstruction or gangrene: Secondary | ICD-10-CM

## 2021-07-11 MED ORDER — OMEPRAZOLE 20 MG PO CPDR: 20.0000 mg | DELAYED_RELEASE_CAPSULE | Freq: Every day | ORAL | 0 refills | Status: DC

## 2021-07-11 NOTE — Progress Notes (Signed)
Subjective:    Patient ID: Dalton Rogers, male    DOB: [birth year], 30 y.o.   MRN: [id number]  Chief Complaint  Patient presents with   Hiatal Hernia    Could not get chicken down, went to ER but while there it went down and was able to swallow water so left     HPI Patient was seen today for ongoing concern.  Patient endorses dysphagia at times with solid foods.  Recent episode prompted patient to go to the emergency department after a piece of chicken got stuck in his esophagus causing emesis every 10 minutes.  Patient had emesis in the ED which may have dislodged the piece of chicken.  Patient left without being seen after an extended wait.  Patient notes incidental history of hiatal hernia noted on imaging several years ago.  Patient currently having dysphagia with solids at least once a week.  States it is affecting his quality of life.  Denies overt heartburn symptoms.  Eating smaller bites and thoroughly chewing food.  Past Medical History:  Diagnosis Date   Anxiety    Hyperlipidemia     No Known Allergies  ROS General: Denies fever, chills, night sweats, changes in weight, changes in appetite HEENT: Denies headaches, ear pain, changes in vision, rhinorrhea, sore throat CV: Denies CP, palpitations, SOB, orthopnea Pulm: Denies SOB, cough, wheezing GI: Denies abdominal pain, nausea, vomiting, diarrhea, constipation  + dysphagia with solids GU: Denies dysuria, hematuria, frequency Msk: Denies muscle cramps, joint pains Neuro: Denies weakness, numbness, tingling Skin: Denies rashes, bruising Psych: Denies depression, anxiety, hallucinations     Objective:    Blood pressure 118/84, pulse 80, temperature 98.4 F (36.9 C), temperature source Oral, weight 219 lb 9.6 oz (99.6 kg), SpO2 98 %.   Gen. Pleasant, well-nourished, in no distress, normal affect   HEENT: [place]/AT, face symmetric, conjunctiva clear, no scleral icterus, PERRLA, EOMI, nares patent without drainage Lungs: no  accessory muscle use Cardiovascular: RRR, no peripheral edema Musculoskeletal: No deformities, no cyanosis or clubbing, normal tone Neuro:  A&Ox3, CN II-XII intact, normal gait Skin:  Warm, no lesions/ rash   Wt Readings from Last 3 Encounters:  07/09/21 215 lb (97.5 kg)  07/19/19 218 lb (98.9 kg)  04/06/19 224 lb 3.2 oz (101.7 kg)    Lab Results  Component Value Date   WBC 6.0 10/15/2020   HGB 15.0 10/15/2020   HCT 44.0 10/15/2020   PLT 293.0 10/15/2020   GLUCOSE 90 10/15/2020   CHOL 165 10/15/2020   TRIG 146.0 10/15/2020   HDL 44.50 10/15/2020   LDLCALC 91 10/15/2020   ALT 35 10/15/2020   AST 21 10/15/2020   NA 141 10/15/2020   K 4.0 10/15/2020   CL 104 10/15/2020   CREATININE 0.99 10/15/2020   BUN 10 10/15/2020   CO2 30 10/15/2020   TSH 1.97 10/15/2020    Assessment/Plan:  Dysphagia, unspecified type  -Continue general precautions including taking small bites and taking time to chew food -discussed starting Prilosec 20 mg -Given handout - Plan: Ambulatory referral to Gastroenterology, omeprazole (PRILOSEC) 20 MG capsule  Hiatal hernia  -[place] -Given handout - Plan: Ambulatory referral to Gastroenterology, omeprazole (PRILOSEC) 20 MG capsule  F/u prn  Abbe Amsterdam, MD

## 2021-07-18 ENCOUNTER — Encounter: Payer: Self-pay | Admitting: Gastroenterology

## 2021-08-20 ENCOUNTER — Ambulatory Visit (INDEPENDENT_AMBULATORY_CARE_PROVIDER_SITE_OTHER): Payer: No Typology Code available for payment source | Admitting: Gastroenterology

## 2021-08-20 ENCOUNTER — Encounter: Payer: Self-pay | Admitting: Gastroenterology

## 2021-08-20 VITALS — BP 122/70 | HR 100 | Ht 72.0 in | Wt 221.0 lb

## 2021-08-20 DIAGNOSIS — R1319 Other dysphagia: Secondary | ICD-10-CM | POA: Diagnosis not present

## 2021-08-20 NOTE — Patient Instructions (Signed)
If you are age 32 or older, your body mass index should be between 23-30. Your Body mass index is 29.97 kg/m. If this is out of the aforementioned range listed, please consider follow up with your Primary Care Provider.  If you are age 30 or younger, your body mass index should be between 19-25. Your Body mass index is 29.97 kg/m. If this is out of the aformentioned range listed, please consider follow up with your Primary Care Provider.   You have been scheduled for an endoscopy. Please follow written instructions given to you at your visit today. If you use inhalers (even only as needed), please bring them with you on the day of your procedure.  The [facility] providers would like to encourage you to use [facility] to communicate with providers for non-urgent requests or questions.  Due to long hold times on the telephone, sending your provider a message by [facility] may be a faster and more efficient way to get a response.  Please allow 48 business hours for a response.  Please remember that this is for non-urgent requests.   It was a pleasure to see you today!  Thank you for trusting me with your gastrointestinal care!    Scott E.Tomasa Rand, MD

## 2021-08-20 NOTE — Progress Notes (Signed)
HPI : Dalton Rogers is a very pleasant 31 year old male referred to [place] by Dr. Betty [place] for further evaluation of dysphagia.  Patient has had occasional dysphagia for years, but recently the symptoms have worsened.  He went to the ED Nov 30th after getting chicken stuck in his esophagus for a few hours, but he was able to vomit it up.  He has to forcefully vomit up stuck food about once every week or two.  He has changed his eating habits recently to reduce his symptoms.  He denies GERD symptoms.  He occasionally has a dull pain in his epigastrium.  No liquid dysphagia.  No unintentional weight loss He reports being told he had a hiatal hernia incidentally noted on calcium score cardiac CT  He was prescribed omeprazole by Dr. [place] a month ago but he was not aware of this prescription and so has not been taking it. No history of atopic disorders for himself or his family.  No family history of GI malignancy. He has never had an upper endoscopy.  Past Medical History:  Diagnosis Date   Anxiety    Hyperlipidemia      Past Surgical History:  Procedure Laterality Date   hyperlipidemia     Family History  Problem Relation Age of Onset   Heart disease Father    Hyperlipidemia Father    Hypertension Father    Social History   Tobacco Use   Smoking status: Never   Smokeless tobacco: Never  Vaping Use   Vaping Use: Never used  Substance Use Topics   Alcohol use: Yes    Comment: ocassionally   Drug use: Not Currently   Current Outpatient Medications  Medication Sig Dispense Refill   atorvastatin (LIPITOR) 20 MG tablet Take 1 tablet (20 mg total) by mouth daily. 90 tablet 1   Cholecalciferol (VITAMIN D3) 125 MCG (5000 UT) CAPS Take by mouth. (Patient not taking: Reported on 07/11/2021)     Fe Fum-FePoly-FA-Vit C-Vit B3 (FOLIVANE-F) 125-1 MG CAPS Take 1 capsule by mouth daily. (Patient not taking: Reported on 07/11/2021) 90 capsule 1   omeprazole (PRILOSEC) 20 MG capsule Take 1  capsule (20 mg total) by mouth daily. 30 capsule 0   PARoxetine (PAXIL) 20 MG tablet TAKE 1 TABLET BY MOUTH EVERY DAY 90 tablet 1   vitamin B-12 (CYANOCOBALAMIN) 500 MCG tablet Take 500 mcg by mouth daily. (Patient not taking: Reported on 07/11/2021)     No current facility-administered medications for this visit.   No Known Allergies   Review of Systems: All systems reviewed and negative except where noted in HPI.    No results found.  Physical Exam: Ht 6' (1.829 m)    Wt 221 lb (100.2 kg)    BMI 29.97 kg/m  Constitutional: Pleasant,well-developed, Caucasian male in no acute distress. HEENT: Normocephalic and atraumatic. Conjunctivae are normal. No scleral icterus. MP2 Neck supple.  Cardiovascular: Normal rate, regular rhythm.  Pulmonary/chest: Effort normal and breath sounds normal. No wheezing, rales or rhonchi. Abdominal: Soft, nondistended, nontender. Bowel sounds active throughout. There are no masses palpable. No hepatomegaly. Extremities: no edema Neurological: Alert and oriented to person place and time. Skin: Skin is warm and dry. No rashes noted. Psychiatric: Normal mood and affect. Behavior is normal.  CBC    Component Value Date/Time   WBC 6.0 10/15/2020 0801   RBC 4.87 10/15/2020 0801   HGB 15.0 10/15/2020 0801   HCT 44.0 10/15/2020 0801   PLT 293.0 10/15/2020 0801  MCV 90.3 10/15/2020 0801   MCHC 34.0 10/15/2020 0801   RDW 13.2 10/15/2020 0801    CMP     Component Value Date/Time   NA 141 10/15/2020 0801   K 4.0 10/15/2020 0801   CL 104 10/15/2020 0801   CO2 30 10/15/2020 0801   GLUCOSE 90 10/15/2020 0801   BUN 10 10/15/2020 0801   CREATININE 0.99 10/15/2020 0801   CALCIUM 9.8 10/15/2020 0801   PROT 6.9 10/15/2020 0801   ALBUMIN 4.6 10/15/2020 0801   AST 21 10/15/2020 0801   ALT 35 10/15/2020 0801   ALKPHOS 81 10/15/2020 0801   BILITOT 0.4 10/15/2020 0801     ASSESSMENT AND PLAN: 31 year old with chronic dysphagia requiring regular forced  emesis and recent transient esophageal food impaction.  No history of GERD symptoms, but was noted to incidentally have a hiatal hernia.  Most likely diagnoses include EoE vs. peptic stricture from silent reflux.  EGD is warranted.  Will plan to dilate any focal strictures.  Dysphagia - EGD with possible dilation - Further recommendations to follow after EGD  The details, risks (including bleeding, perforation, infection, missed lesions, medication reactions and possible hospitalization or surgery if complications occur), benefits, and alternatives to EGD with possible biopsy and possible dilation were discussed with the patient and he consents to proceed.   [name] E. Tomasa Rand, MD [facility]  CC:  Deeann Saint, MD

## 2021-08-21 ENCOUNTER — Other Ambulatory Visit: Payer: Self-pay | Admitting: Family Medicine

## 2021-08-26 ENCOUNTER — Encounter: Payer: No Typology Code available for payment source | Admitting: Family Medicine

## 2021-08-26 ENCOUNTER — Other Ambulatory Visit: Payer: Self-pay

## 2021-08-26 ENCOUNTER — Ambulatory Visit (AMBULATORY_SURGERY_CENTER): Payer: No Typology Code available for payment source | Admitting: Gastroenterology

## 2021-08-26 ENCOUNTER — Encounter: Payer: Self-pay | Admitting: Gastroenterology

## 2021-08-26 VITALS — BP 105/63 | HR 69 | Temp 98.0°F | Resp 13 | Ht 72.0 in | Wt 221.0 lb

## 2021-08-26 DIAGNOSIS — K449 Diaphragmatic hernia without obstruction or gangrene: Secondary | ICD-10-CM | POA: Diagnosis not present

## 2021-08-26 DIAGNOSIS — R1319 Other dysphagia: Secondary | ICD-10-CM

## 2021-08-26 MED ORDER — SODIUM CHLORIDE 0.9 % IV SOLN: 500.0000 mL | Freq: Once | INTRAVENOUS | Status: DC

## 2021-08-26 MED ORDER — OMEPRAZOLE 20 MG PO CPDR: 20.0000 mg | DELAYED_RELEASE_CAPSULE | Freq: Every day | ORAL | 3 refills | Status: DC

## 2021-08-26 NOTE — Op Note (Signed)
[facility] Patient Name: Dalton Rogers Procedure Date: 08/26/2021 11:43 AM MRN: [id number] Endoscopist: Lorin Picket E. Tomasa Rand , MD Age: 31 Referring MD:  Date of Birth: 18-Jan-1991 Gender: Male Account #: [id number] Procedure:                Upper GI endoscopy Indications:              Dysphagia Medicines:                Monitored Anesthesia Care Procedure:                Pre-Anesthesia Assessment:                           - Prior to the procedure, a History and Physical                            was performed, and patient medications and                            allergies were reviewed. The patient's tolerance of                            previous anesthesia was also reviewed. The risks                            and benefits of the procedure and the sedation                            options and risks were discussed with the patient.                            All questions were answered, and informed consent                            was obtained. Prior Anticoagulants: The patient has                            taken no previous anticoagulant or antiplatelet                            agents. ASA Grade Assessment: II - A patient with                            mild systemic disease. After reviewing the risks                            and benefits, the patient was deemed in                            satisfactory condition to undergo the procedure.                           After obtaining informed consent, the endoscope was  passed under direct vision. Throughout the                            procedure, the patient's blood pressure, pulse, and                            oxygen saturations were monitored continuously. The                            GIF [id number] #[id number] was introduced through the                            mouth, and advanced to the third part of duodenum.                            The upper GI endoscopy was accomplished  without                            difficulty. The patient tolerated the procedure but                            was coughing and retching continuously. Scope In: Scope Out: Findings:                 The examined portions of the nasopharynx,                            oropharynx and larynx were normal.                           The examined esophagus was normal. An obvious                            stricture/narrowing of the esophagus was not seen.                           A large type-III paraesophageal hernia was found.                           The exam of the stomach was otherwise normal.                           The examined duodenum was normal. Complications:            No immediate complications. Estimated Blood Loss:     Estimated blood loss: none. Impression:               - The examined portions of the nasopharynx,                            oropharynx and larynx were normal.                           - Normal esophagus. No obvious stricture. No reflux  esophagitis. No endoscopic abnormalities to suspect                            eosinophilic esophagitis.                           - Large paraesophageal hernia. I suspect this is                            the primary cause of the patient's symptoms                           - Normal examined duodenum.                           - No specimens collected. Recommendation:           - Patient has a contact number available for                            emergencies. The signs and symptoms of potential                            delayed complications were discussed with the                            patient. Return to normal activities tomorrow.                            Written discharge instructions were provided to the                            patient.                           - Resume previous diet.                           - Use Prilosec (omeprazole) 20 mg PO daily.                            - Perform a CT scan (computed tomography) of chest                            with contrast and abdomen with contrast at                            appointment to be scheduled.                           - Will refer to surgery if CT scan confirms                            paraesophageal hernia Glema Takaki E. Tomasa Rand, MD 08/26/2021 12:07:05 PM This report has been signed electronically.

## 2021-08-26 NOTE — Patient Instructions (Signed)
Impression/Recommendations:  Resume previous diet. Use Prilosec (omeprazole) 20 mg. By mouth daily.  Perform CT scan of chest with contrast and abdomen with contrast at appointment to be scheduled.  Will refer to surgery if CT scan confirms paraesophageal hernia.  YOU HAD AN ENDOSCOPIC PROCEDURE TODAY AT THE [facility]:   Refer to the procedure report that was given to you for any specific questions about what was found during the examination.  If the procedure report does not answer your questions, please call your gastroenterologist to clarify.  If you requested that your care partner not be given the details of your procedure findings, then the procedure report has been included in a sealed envelope for you to review at your convenience later.  YOU SHOULD EXPECT: Some feelings of bloating in the abdomen. Passage of more gas than usual.  Walking can help get rid of the air that was put into your GI tract during the procedure and reduce the bloating. If you had a lower endoscopy (such as a colonoscopy or flexible sigmoidoscopy) you may notice spotting of blood in your stool or on the toilet paper. If you underwent a bowel prep for your procedure, you may not have a normal bowel movement for a few days.  Please Note:  You might notice some irritation and congestion in your nose or some drainage.  This is from the oxygen used during your procedure.  There is no need for concern and it should clear up in a day or so.  SYMPTOMS TO REPORT IMMEDIATELY:  Following upper endoscopy (EGD)  Vomiting of blood or coffee ground material  New chest pain or pain under the shoulder blades  Painful or persistently difficult swallowing  New shortness of breath  Fever of 100F or higher  Black, tarry-looking stools  For urgent or emergent issues, a gastroenterologist can be reached at any hour by calling [phone]. Do not use MyChart messaging for urgent concerns.    DIET:  We do  recommend a small meal at first, but then you may proceed to your regular diet.  Drink plenty of fluids but you should avoid alcoholic beverages for 24 hours.  ACTIVITY:  You should plan to take it easy for the rest of today and you should NOT DRIVE or use heavy machinery until tomorrow (because of the sedation medicines used during the test).    FOLLOW UP: Our staff will call the number listed on your records 48-72 hours following your procedure to check on you and address any questions or concerns that you may have regarding the information given to you following your procedure. If we do not reach you, we will leave a message.  We will attempt to reach you two times.  During this call, we will ask if you have developed any symptoms of COVID 19. If you develop any symptoms (ie: fever, flu-like symptoms, shortness of breath, cough etc.) before then, please call [phone].  If you test positive for Covid 19 in the 2 weeks post procedure, please call and report this information to [place].    If any biopsies were taken you will be contacted by phone or by letter within the next 1-3 weeks.  Please call us at [phone] if you have not heard about the biopsies in 3 weeks.    SIGNATURES/CONFIDENTIALITY: You and/or your care partner have signed paperwork which will be entered into your electronic medical record.  These signatures attest to the fact that that the information above  on your After Visit Summary has been reviewed and is understood.  Full responsibility of the confidentiality of this discharge information lies with you and/or your care-partner.

## 2021-08-26 NOTE — Progress Notes (Signed)
Medical history reviewed with patient; no changes from 08/20/21 office visit.  VS by CW.

## 2021-08-26 NOTE — Progress Notes (Signed)
History and Physical Interval Note:  08/26/2021 11:45 AM  Dalton Rogers  has presented today for endoscopic procedure(s), with the diagnosis of  Encounter Diagnosis  Name Primary?   Esophageal dysphagia Yes  .  The various methods of evaluation and treatment have been discussed with the patient and/or family. After consideration of risks, benefits and other options for treatment, the patient has consented to  the endoscopic procedure(s).   The patient's history has been reviewed, patient examined, no change in status, stable for endoscopic procedure(s).  I have reviewed the patient's chart and labs.  Questions were answered to the patient's satisfaction.     Madesyn Ast E. Candis Schatz, MD [facility]

## 2021-08-26 NOTE — Progress Notes (Signed)
To pacu, VSS. Report to [email or web address] 

## 2021-08-27 ENCOUNTER — Other Ambulatory Visit: Payer: Self-pay

## 2021-08-27 DIAGNOSIS — R1319 Other dysphagia: Secondary | ICD-10-CM

## 2021-08-28 ENCOUNTER — Telehealth: Payer: Self-pay | Admitting: *Deleted

## 2021-08-28 NOTE — Telephone Encounter (Signed)
°  Follow up Call-  Call back number 08/26/2021  Post procedure Call Back phone  # [phone]  Permission to leave phone message Yes  Some recent data might be hidden     Patient questions:  Do you have a fever, pain , or abdominal swelling? No. Pain Score  0 *  Have you tolerated food without any problems? Yes.    Have you been able to return to your normal activities? Yes.    Do you have any questions about your discharge instructions: Diet   No. Medications  No. Follow up visit  No.  Do you have questions or concerns about your Care? No.  Actions: * If pain score is 4 or above: No action needed, pain <4.

## 2021-09-02 NOTE — Progress Notes (Signed)
HPI: [name] is a 31 y.o.male here today for his routine physical examination.  Last CPE: 2020  Regular exercise 3 or more times per week: Not consistently. Following a healthy diet: Better in the past couple weeks, he eats out sometimes. He started intermittent fasting a week ago for the past week.  Chronic medical problems: Anxiety,hiatal hernia,and HLD among some.  Immunization History  Administered Date(s) Administered   Influenza,inj,Quad PF,6+ Mos 07/19/2019    Health Maintenance  Topic Date Due   INFLUENZA VACCINE  11/08/2021 (Originally 03/11/2021)   HIV Screening  07/18/2024 (Originally 07/23/2005)   TETANUS/TDAP  06/30/2028   Hepatitis C Screening  Completed   HPV VACCINES  Aged Out   -Negative for high alcohol intake or tobacco use.  -Concerns and/or follow up today:   Hyperlipidemia: Currently on Atorvastatin 20 mg daily.  Lab Results  Component Value Date   CHOL 165 10/15/2020   HDL 44.50 10/15/2020   LDLCALC 91 10/15/2020   TRIG 146.0 10/15/2020   CHOLHDL 4 10/15/2020   Since his last visit he has seen GI and underwent EGD.He was referred because episode of h dysphagia that required ED evaluation. Dx's with large paraesophageal hiatal, surgical treatment was recommended. Pending chest CT.  Frequently he has to regurgitate food because feels like it gets stuck in the meddle of his chest. He was prescribed Omeprazole, he is not sure if he needs it, has not taken it. Since EGD he has been more cautious with food intake,chewing well and eating small bites at the time.  Anxiety: Currently he is on Paxil 20 mg daily, which he has taken for years and is still helping.  Review of Systems  Constitutional:  Negative for activity change, appetite change, fatigue and fever.  HENT:  Positive for trouble swallowing. Negative for hearing loss, nosebleeds and sore throat.   Eyes:  Negative for redness and visual disturbance.  Respiratory:  Negative for  cough, shortness of breath and wheezing.   Cardiovascular:  Negative for chest pain, palpitations and leg swelling.  Gastrointestinal:  Negative for abdominal pain, blood in stool, nausea and vomiting.  Endocrine: Negative for cold intolerance, heat intolerance, polydipsia, polyphagia and polyuria.  Genitourinary:  Negative for decreased urine volume, dysuria, genital sores, hematuria and testicular pain.  Musculoskeletal:  Negative for gait problem and myalgias.  Skin:  Negative for color change and rash.  Allergic/Immunologic: Negative for environmental allergies.  Neurological:  Negative for dizziness, syncope, weakness and headaches.  Hematological:  Negative for adenopathy. Does not bruise/bleed easily.  Psychiatric/Behavioral:  Negative for confusion and sleep disturbance.    Current Outpatient Medications on File Prior to Visit  Medication Sig Dispense Refill   atorvastatin (LIPITOR) 20 MG tablet Take 1 tablet (20 mg total) by mouth daily. 90 tablet 1   omeprazole (PRILOSEC) 20 MG capsule Take 1 capsule (20 mg total) by mouth daily. 90 capsule 3   PARoxetine (PAXIL) 20 MG tablet TAKE 1 TABLET BY MOUTH EVERY DAY 90 tablet 1   No current facility-administered medications on file prior to visit.   Past Medical History:  Diagnosis Date   Anxiety    Hyperlipidemia    Past Surgical History:  Procedure Laterality Date   hyperlipidemia     No Known Allergies  Family History  Problem Relation Age of Onset   Heart disease Father    Hyperlipidemia Father    Hypertension Father    Colon cancer Neg Hx    Colon polyps Neg Hx  Rectal cancer Neg Hx    Social History   Socioeconomic History   Marital status: Married    Spouse name: Not on file   Number of children: Not on file   Years of education: Not on file   Highest education level: Not on file  Occupational History   Not on file  Tobacco Use   Smoking status: Never   Smokeless tobacco: Never  Vaping Use   Vaping  Use: Never used  Substance and Sexual Activity   Alcohol use: Yes    Comment: ocassionally   Drug use: Not Currently   Sexual activity: Yes  Other Topics Concern   Not on file  Social History Narrative   Not on file   Social Determinants of Health   Financial Resource Strain: Not on file  Food Insecurity: Not on file  Transportation Needs: Not on file  Physical Activity: Not on file  Stress: Not on file  Social Connections: Not on file   Vitals:   09/03/21 0856  BP: 124/80  Pulse: 86  Resp: 16  SpO2: 97%   Body mass index is 29.57 kg/m.  Wt Readings from Last 3 Encounters:  09/03/21 218 lb (98.9 kg)  08/26/21 221 lb (100.2 kg)  08/20/21 221 lb (100.2 kg)   Physical Exam Vitals and nursing note reviewed.  Constitutional:      General: He is not in acute distress.    Appearance: He is well-developed and well-groomed.  HENT:     Head: Normocephalic and atraumatic.     Right Ear: Tympanic membrane, ear canal and external ear normal.     Left Ear: Tympanic membrane, ear canal and external ear normal.     Mouth/Throat:     Mouth: Mucous membranes are moist.     Pharynx: Oropharynx is clear.  Eyes:     Extraocular Movements: Extraocular movements intact.     Conjunctiva/sclera: Conjunctivae normal.     Pupils: Pupils are equal, round, and reactive to light.  Neck:     Thyroid: No thyromegaly.     Trachea: No tracheal deviation.  Cardiovascular:     Rate and Rhythm: Normal rate and regular rhythm.     Pulses:          Dorsalis pedis pulses are 2+ on the right side and 2+ on the left side.     Heart sounds: No murmur heard. Pulmonary:     Effort: Pulmonary effort is normal. No respiratory distress.     Breath sounds: Normal breath sounds.  Abdominal:     Palpations: Abdomen is soft. There is no hepatomegaly or mass.     Tenderness: There is no abdominal tenderness.  Genitourinary:    Comments: No concerns. Musculoskeletal:        General: No tenderness.      Cervical back: Normal range of motion.     Comments: No major deformities appreciated and no signs of synovitis.  Lymphadenopathy:     Cervical: No cervical adenopathy.     Upper Body:     Right upper body: No supraclavicular adenopathy.     Left upper body: No supraclavicular adenopathy.  Skin:    General: Skin is warm.     Findings: No erythema.  Neurological:     Mental Status: He is alert and oriented to person, place, and time.     Cranial Nerves: No cranial nerve deficit.     Sensory: No sensory deficit.     Coordination: Coordination normal.  Gait: Gait normal.     Deep Tendon Reflexes:     Reflex Scores:      Bicep reflexes are 2+ on the right side and 2+ on the left side.      Patellar reflexes are 2+ on the right side and 2+ on the left side. Psychiatric:        Mood and Affect: Mood and affect normal.   ASSESSMENT AND PLAN:  Mr.Quaran was seen today for annual exam.  Diagnoses and all orders for this visit: Orders Placed This Encounter  Procedures   Lipid panel   CMP   Hemoglobin A1c   Lab Results  Component Value Date   CHOL 143 09/03/2021   HDL 38.00 (L) 09/03/2021   LDLCALC 93 09/03/2021   TRIG 63.0 09/03/2021   CHOLHDL 4 09/03/2021   Lab Results  Component Value Date   CREATININE 1.02 09/03/2021   BUN 12 09/03/2021   NA 139 09/03/2021   K 4.1 09/03/2021   CL 103 09/03/2021   CO2 30 09/03/2021   Lab Results  Component Value Date   ALT 34 09/03/2021   AST 24 09/03/2021   ALKPHOS 77 09/03/2021   BILITOT 0.4 09/03/2021   Lab Results  Component Value Date   HGBA1C 5.9 09/03/2021   Routine general medical examination at a health care facility We discussed the importance of regular physical activity and healthy diet for prevention of chronic illness and/or complications. Preventive guidelines reviewed. Vaccination up to date.  Next CPE in a year.  Screening for endocrine, metabolic, and immunity disorder -     Hemoglobin A1c -      CMP  Hyperlipidemia, unspecified Continue Atorvastatin 20 mg daily and low fat diet. Further recommendations according to FLP result.  Anxiety disorder, unspecified Problem is well controlled. Continue Paxil 20 mg daily.  Esophageal dysphagia EGD showed paraesophageal hernia. Pending chest and abdominal CT. Recommend completing 6 to 8 weeks of omeprazole 20 mg daily.  Continue eating small bites at the time and chewing well/slow. Following with GI.  Return in 1 year (on 09/03/2022) for CPE.  Jamire Shabazz G. [place], MD  [facility]. [place] office.

## 2021-09-03 ENCOUNTER — Encounter: Payer: Self-pay | Admitting: Family Medicine

## 2021-09-03 ENCOUNTER — Ambulatory Visit (INDEPENDENT_AMBULATORY_CARE_PROVIDER_SITE_OTHER): Payer: No Typology Code available for payment source | Admitting: Family Medicine

## 2021-09-03 VITALS — BP 124/80 | HR 86 | Resp 16 | Ht 72.0 in | Wt 218.0 lb

## 2021-09-03 DIAGNOSIS — F419 Anxiety disorder, unspecified: Secondary | ICD-10-CM

## 2021-09-03 DIAGNOSIS — Z13228 Encounter for screening for other metabolic disorders: Secondary | ICD-10-CM

## 2021-09-03 DIAGNOSIS — Z130 Encounter for screening for diseases of the blood and blood-forming organs and certain disorders involving the immune mechanism: Secondary | ICD-10-CM

## 2021-09-03 DIAGNOSIS — Z1329 Encounter for screening for other suspected endocrine disorder: Secondary | ICD-10-CM | POA: Diagnosis not present

## 2021-09-03 DIAGNOSIS — R1319 Other dysphagia: Secondary | ICD-10-CM | POA: Insufficient documentation

## 2021-09-03 DIAGNOSIS — Z0000 Encounter for general adult medical examination without abnormal findings: Secondary | ICD-10-CM | POA: Diagnosis not present

## 2021-09-03 DIAGNOSIS — E785 Hyperlipidemia, unspecified: Secondary | ICD-10-CM

## 2021-09-03 LAB — COMPREHENSIVE METABOLIC PANEL
ALT: 34 U/L (ref 0–53)
AST: 24 U/L (ref 0–37)
Albumin: 4.7 g/dL (ref 3.5–5.2)
Alkaline Phosphatase: 77 U/L (ref 39–117)
BUN: 12 mg/dL (ref 6–23)
CO2: 30 mEq/L (ref 19–32)
Calcium: 9.8 mg/dL (ref 8.4–10.5)
Chloride: 103 mEq/L (ref 96–112)
Creatinine, Ser: 1.02 mg/dL (ref 0.40–1.50)
GFR: 98.21 mL/min (ref >60.00)
Glucose, Bld: 90 mg/dL (ref 70–99)
Potassium: 4.1 mEq/L (ref 3.5–5.1)
Sodium: 139 mEq/L (ref 135–145)
Total Bilirubin: 0.4 mg/dL (ref 0.2–1.2)
Total Protein: 7.3 g/dL (ref 6.0–8.3)

## 2021-09-03 LAB — HEMOGLOBIN A1C: Hgb A1c MFr Bld: 5.9 % (ref 4.6–6.5)

## 2021-09-03 LAB — LIPID PANEL
Cholesterol: 143 mg/dL (ref 0–200)
HDL: 38 mg/dL — ABNORMAL LOW (ref >39.00)
LDL Cholesterol: 93 mg/dL (ref 0–99)
NonHDL: 105.1
Total CHOL/HDL Ratio: 4
Triglycerides: 63 mg/dL (ref 0.0–149.0)
VLDL: 12.6 mg/dL (ref 0.0–40.0)

## 2021-09-03 MED ORDER — ATORVASTATIN CALCIUM 20 MG PO TABS: 20.0000 mg | ORAL_TABLET | Freq: Every day | ORAL | 3 refills | Status: DC

## 2021-09-03 NOTE — Patient Instructions (Addendum)
A few things to remember from today's visit:  Routine general medical examination at a health care facility  Hyperlipidemia, unspecified hyperlipidemia type - Plan: Lipid panel  Anxiety disorder, unspecified type  Screening for endocrine, metabolic, and immunity disorder - Plan: CMP, Hemoglobin A1c  If you need refills please call your pharmacy. Do not use My Chart to request refills or for acute issues that need immediate attention.   Please be sure medication list is accurate. If a new problem present, please set up appointment sooner than planned today.  Health Maintenance, Male Adopting a healthy lifestyle and getting preventive care are important in promoting health and wellness. Ask your health care provider about: The right schedule for you to have regular tests and exams. Things you can do on your own to prevent diseases and keep yourself healthy. What should I know about diet, weight, and exercise? Eat a healthy diet  Eat a diet that includes plenty of vegetables, fruits, low-fat dairy products, and lean protein. Do not eat a lot of foods that are high in solid fats, added sugars, or sodium. Maintain a healthy weight Body mass index (BMI) is a measurement that can be used to identify possible weight problems. It estimates body fat based on height and weight. Your health care provider can help determine your BMI and help you achieve or maintain a healthy weight. Get regular exercise Get regular exercise. This is one of the most important things you can do for your health. Most adults should: Exercise for at least 150 minutes each week. The exercise should increase your heart rate and make you sweat (moderate-intensity exercise). Do strengthening exercises at least twice a week. This is in addition to the moderate-intensity exercise. Spend less time sitting. Even light physical activity can be beneficial. Watch cholesterol and blood lipids Have your blood tested for lipids and  cholesterol at 32 years of age, then have this test every 5 years. You may need to have your cholesterol levels checked more often if: Your lipid or cholesterol levels are high. You are older than 32 years of age. You are at high risk for heart disease. What should I know about cancer screening? Many types of cancers can be detected early and may often be prevented. Depending on your health history and family history, you may need to have cancer screening at various ages. This may include screening for: Colorectal cancer. Prostate cancer. Skin cancer. Lung cancer. What should I know about heart disease, diabetes, and high blood pressure? Blood pressure and heart disease High blood pressure causes heart disease and increases the risk of stroke. This is more likely to develop in people who have high blood pressure readings or are overweight. Talk with your health care provider about your target blood pressure readings. Have your blood pressure checked: Every 3-5 years if you are 36-80 years of age. Every year if you are 51 years old or older. If you are between the ages of 94 and 10 and are a current or former smoker, ask your health care provider if you should have a one-time screening for abdominal aortic aneurysm (AAA). Diabetes Have regular diabetes screenings. This checks your fasting blood sugar level. Have the screening done: Once every three years after age 46 if you are at a normal weight and have a low risk for diabetes. More often and at a younger age if you are overweight or have a high risk for diabetes. What should I know about preventing infection? Hepatitis B If  you have a higher risk for hepatitis B, you should be screened for this virus. Talk with your health care provider to find out if you are at risk for hepatitis B infection. Hepatitis C Blood testing is recommended for: Everyone born from [birth year] through 1966. Anyone with known risk factors for hepatitis C. Sexually  transmitted infections (STIs) You should be screened each year for STIs, including gonorrhea and chlamydia, if: You are sexually active and are younger than 32 years of age. You are older than 32 years of age and your health care provider tells you that you are at risk for this type of infection. Your sexual activity has changed since you were last screened, and you are at increased risk for chlamydia or gonorrhea. Ask your health care provider if you are at risk. Ask your health care provider about whether you are at high risk for HIV. Your health care provider may recommend a prescription medicine to help prevent HIV infection. If you choose to take medicine to prevent HIV, you should first get tested for HIV. You should then be tested every 3 months for as long as you are taking the medicine. Follow these instructions at home: Alcohol use Do not drink alcohol if your health care provider tells you not to drink. If you drink alcohol: Limit how much you have to 0-2 drinks a day. Know how much alcohol is in your drink. In the [place] one drink equals one 12 oz bottle of beer (355 mL), one 5 oz glass of wine (148 mL), or one 1 oz glass of hard liquor (44 mL). Lifestyle Do not use any products that contain nicotine or tobacco. These products include cigarettes, chewing tobacco, and vaping devices, such as e-cigarettes. If you need help quitting, ask your health care provider. Do not use street drugs. Do not share needles. Ask your health care provider for help if you need support or information about quitting drugs. General instructions Schedule regular health, dental, and eye exams. Stay current with your vaccines. Tell your health care provider if: You often feel depressed. You have ever been abused or do not feel safe at home. Summary Adopting a healthy lifestyle and getting preventive care are important in promoting health and wellness. Follow your health care provider's instructions about  healthy diet, exercising, and getting tested or screened for diseases. Follow your health care provider's instructions on monitoring your cholesterol and blood pressure. This information is not intended to replace advice given to you by your health care provider. Make sure you discuss any questions you have with your health care provider. Document Revised: 12/17/2020 Document Reviewed: 12/17/2020 Elsevier Patient Education  [employer].

## 2021-09-03 NOTE — Assessment & Plan Note (Signed)
EGD showed paraesophageal hernia. Pending chest and abdominal CT. Recommend completing 6 to 8 weeks of omeprazole 20 mg daily.  Continue eating small bites at the time and chewing well/slow. Following with GI.

## 2021-09-03 NOTE — Assessment & Plan Note (Signed)
Continue Atorvastatin 20 mg daily and low fat diet. Further recommendations according to FLP result. 

## 2021-09-03 NOTE — Assessment & Plan Note (Signed)
Problem is well controlled. Continue Paxil 20 mg daily.

## 2021-09-06 ENCOUNTER — Ambulatory Visit (HOSPITAL_BASED_OUTPATIENT_CLINIC_OR_DEPARTMENT_OTHER)
Admission: RE | Admit: 2021-09-06 | Discharge: 2021-09-06 | Disposition: A | Discharge status: Home / Self Care | Payer: No Typology Code available for payment source | Source: Ambulatory Visit | Attending: Gastroenterology | Admitting: Gastroenterology

## 2021-09-06 ENCOUNTER — Other Ambulatory Visit: Payer: Self-pay

## 2021-09-06 DIAGNOSIS — R1319 Other dysphagia: Secondary | ICD-10-CM | POA: Diagnosis present

## 2021-09-06 MED ORDER — IOHEXOL 300 MG/ML  SOLN
100.0000 mL | Freq: Once | INTRAMUSCULAR | Status: AC | PRN
  Administered 2021-09-06: 100 mL via INTRAVENOUS

## 2021-09-09 NOTE — Progress Notes (Signed)
[place],  Your CT confirmed a large hernia, which appears consistent with a paraesophageal hernia.  I would recommend you get this surgically repaired.  We will place a referral to the [facility] and they should contact you soon  Bonita Quin,  Can you place this surgery consult?

## 2021-09-18 ENCOUNTER — Telehealth: Payer: Self-pay | Admitting: Gastroenterology

## 2021-09-18 NOTE — Telephone Encounter (Signed)
Referral was faxed on 09/10/21. Will refax referral/records again today.

## 2021-09-18 NOTE — Telephone Encounter (Signed)
Inbound call from [facility] called requesting referral and clinical notes be refax ATTN Raven [phone]. States he have an appt with then 2/15

## 2021-09-20 NOTE — Telephone Encounter (Signed)
Referral was refaxed on 2/9. Will refax it again.,

## 2021-09-20 NOTE — Telephone Encounter (Signed)
[facility] called asking if we can please refax referral because it was misplaced by one of the nurses.

## 2021-09-23 NOTE — Telephone Encounter (Signed)
Inbound call from [facility] with [facility] requesting for last office notes to be faxed to her at [phone] please.

## 2021-09-23 NOTE — Telephone Encounter (Signed)
Faxed last office note to [facility] at [phone]

## 2021-11-13 ENCOUNTER — Encounter: Payer: Self-pay | Admitting: Gastroenterology

## 2021-11-20 ENCOUNTER — Encounter: Payer: Self-pay | Admitting: Family Medicine

## 2021-11-20 ENCOUNTER — Ambulatory Visit: Payer: No Typology Code available for payment source | Admitting: Family Medicine

## 2021-11-20 VITALS — BP 114/82 | HR 91 | Temp 98.0°F | Ht 72.0 in | Wt 210.0 lb

## 2021-11-20 DIAGNOSIS — H9201 Otalgia, right ear: Secondary | ICD-10-CM | POA: Diagnosis not present

## 2021-11-20 MED ORDER — FLUTICASONE PROPIONATE 50 MCG/ACT NA SUSP: 2.0000 | Freq: Two times a day (BID) | NASAL | 1 refills | Status: DC

## 2021-11-20 MED ORDER — PREDNISONE 20 MG PO TABS: 40.0000 mg | ORAL_TABLET | Freq: Every day | ORAL | 0 refills | Status: AC

## 2021-11-20 NOTE — Progress Notes (Signed)
? ?  Subjective:  ? ? ? Patient ID: Dalton Rogers, male    DOB: 1990-11-03, 31 y.o.   MRN: [id number] ? ?Chief Complaint  ?Patient presents with  ? right ear pain  ?  Pt states he has had right ear pain and fullness since Sunday night along with right jaw pain.  ? ? ?HPI ?Since 4/9-R ear pain and fullness and jaw pain.  Can't hear out of it.  At night, pain goes to jaw and temple.  Had the URI thing for 2 wks(better x ear).   No f/c.  Used q-tip and some Pink on it.  Did try some bulb irrigation.   No sore throat now.  ?Loud concert 2 wks ago. ? ?There are no preventive care reminders to display for this patient. ? ?Past Medical History:  ?Diagnosis Date  ? Anxiety   ? Hyperlipidemia   ? ? ?Past Surgical History:  ?Procedure Laterality Date  ? hyperlipidemia    ? ? ?Outpatient Medications Prior to Visit  ?Medication Sig Dispense Refill  ? atorvastatin (LIPITOR) 20 MG tablet Take 1 tablet (20 mg total) by mouth daily. 90 tablet 3  ? omeprazole (PRILOSEC) 20 MG capsule Take 1 capsule (20 mg total) by mouth daily. 90 capsule 3  ? PARoxetine (PAXIL) 20 MG tablet TAKE 1 TABLET BY MOUTH EVERY DAY 90 tablet 1  ? ?No facility-administered medications prior to visit.  ? ? ?No Known Allergies ?ROS neg/noncontributory except as noted HPI/below ? ? ?   ?Objective:  ?  ? ?BP 114/82   Pulse 91   Temp 98 ?F (36.7 ?C)   Ht 6' (1.829 m)   Wt 210 lb (95.3 kg)   SpO2 99%   BMI 28.48 kg/m?  ?Wt Readings from Last 3 Encounters:  ?11/20/21 210 lb (95.3 kg)  ?09/03/21 218 lb (98.9 kg)  ?08/26/21 221 lb (100.2 kg)  ? ? ?Physical Exam  ? ?Gen: WDWN NAD ?HEENT: NCAT, conjunctiva not injected, sclera nonicteric ?TM WNL B, OP moist, no exudates.  No wax.  R canal-scabbed papule.  No mastoid/tragal/face tenderness ?NECK:  supple, no thyromegaly, no nodes, no carotid bruits ?CARDIAC: RRR, S1S2+, no murmur. DP 2+B ?LUNGS: CTAB. No wheezes ?EXT:  no edema ?MSK: no gross abnormalities.  ?NEURO: A&O x3.  CN II-XII intact.  ?PSYCH: normal mood.  Good eye contact ? ?   ?Assessment & Plan:  ? ?Problem List Items Addressed This Visit   ?None ?Visit Diagnoses   ? ? Ear pain, right    -  Primary  ? ?  ? R ear pain-looks like may have had a pimple or something(scabbed now).  TM normal.  ? ETD from URI.  ? Hearing loss from concert and URI.   Will do pred and flonase.  If not resolving, ENT/aud, ? ?Meds ordered this encounter  ?Medications  ? fluticasone (FLONASE) 50 MCG/ACT nasal spray  ?  Sig: Place 2 sprays into both nostrils in the morning and at bedtime.  ?  Dispense:  16 g  ?  Refill:  1  ? predniSONE (DELTASONE) 20 MG tablet  ?  Sig: Take 2 tablets (40 mg total) by mouth daily with breakfast for 5 days.  ?  Dispense:  10 tablet  ?  Refill:  0  ? ? ?Angelena Sole, MD ? ?

## 2021-11-20 NOTE — Patient Instructions (Signed)
Take prednisone(orally) and flonase(nasally).   If things worsen/not improving, let us know-may need ENT or audiologist.  No need to put anything into ear right now. ?Use hearing protection. ?

## 2022-01-27 IMAGING — CT CT ABDOMEN W/ CM
2 of 4 series · 15 of 46 positions shown, 17 images · IV contrast (agent unspecified)
Comparison: None.

CLINICAL DATA: Dysphagia-?paraesophageal hernia

EXAM:
CT CHEST AND ABDOMEN WITH CONTRAST
TECHNIQUE: Multidetector CT imaging of the chest and abdomen was performed
following the standard protocol during bolus administration of
intravenous contrast.

[Series 2: cap with · axial · 0.80mm/px · z∈[-493,-73]mm · 12 of 100 slices shown, 14 images]
[im 8/100  soft-tissue]
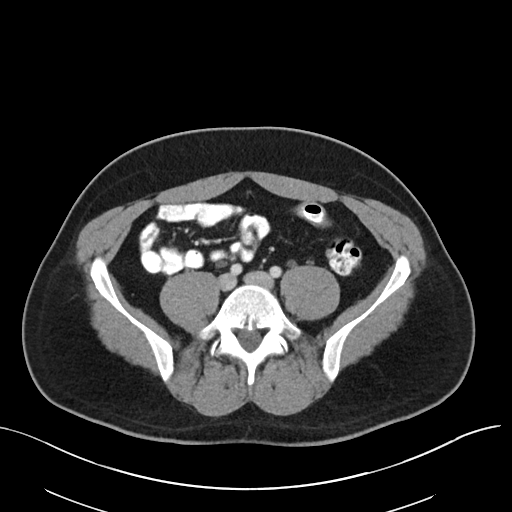
[im 8/100  bone]
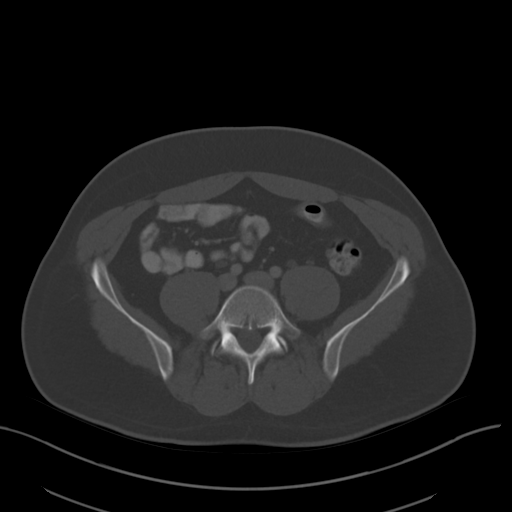
[im 16/100  soft-tissue]
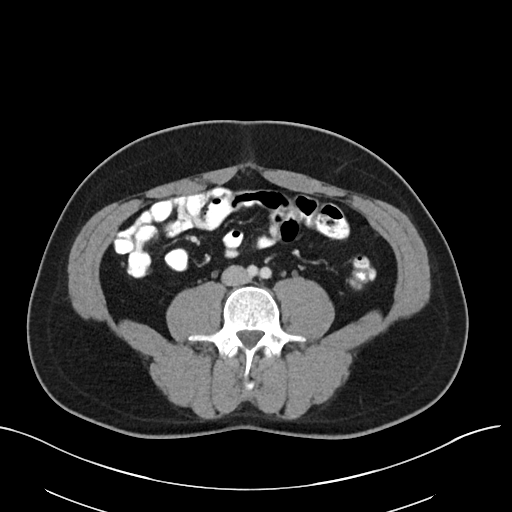
[im 23/100  soft-tissue]
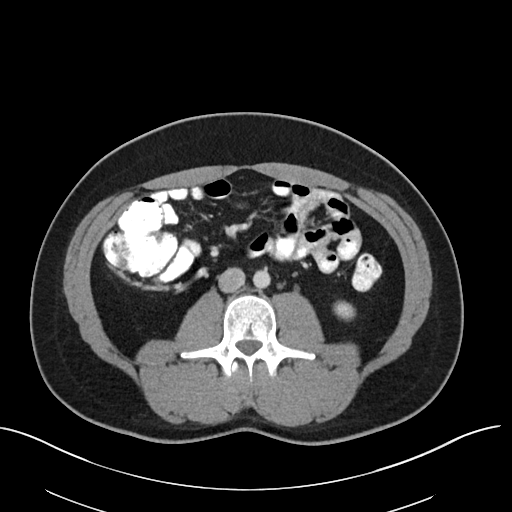
[im 31/100  soft-tissue]
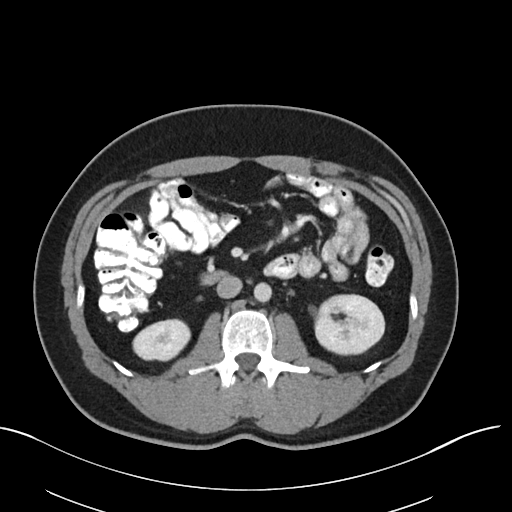
[im 39/100  soft-tissue]
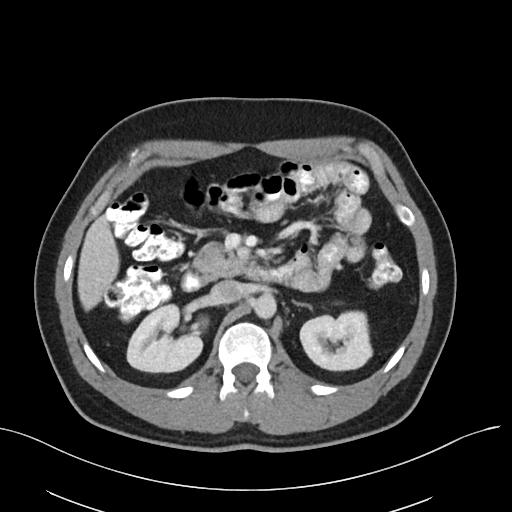
[im 46/100  soft-tissue]
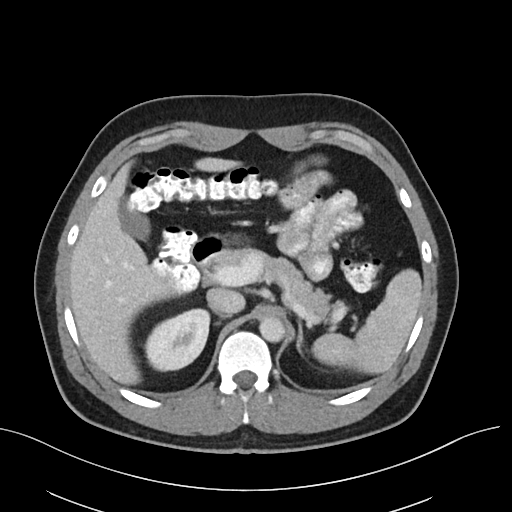
[im 54/100  soft-tissue]
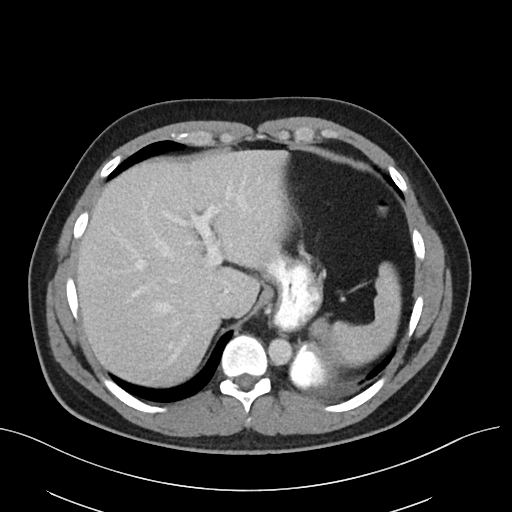
[im 61/100  soft-tissue]
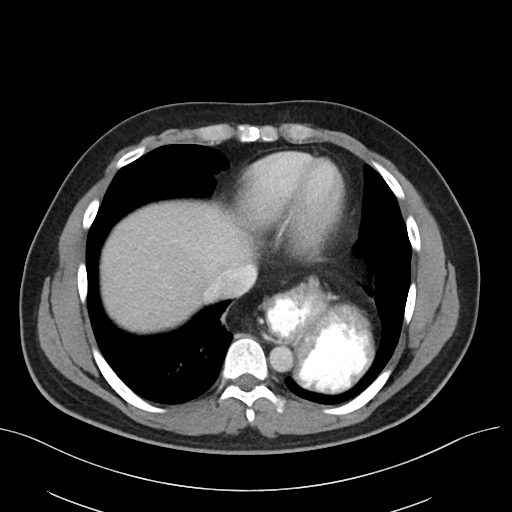
[im 69/100  soft-tissue]
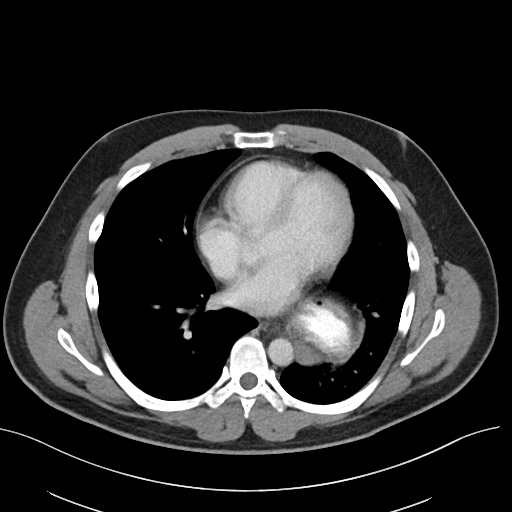
[im 69/100  bone]
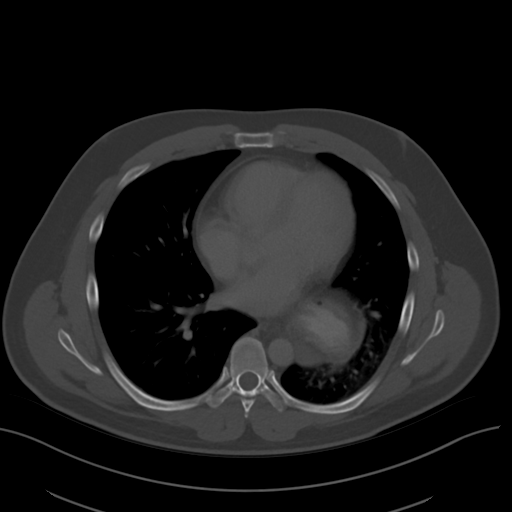
[im 77/100  soft-tissue]
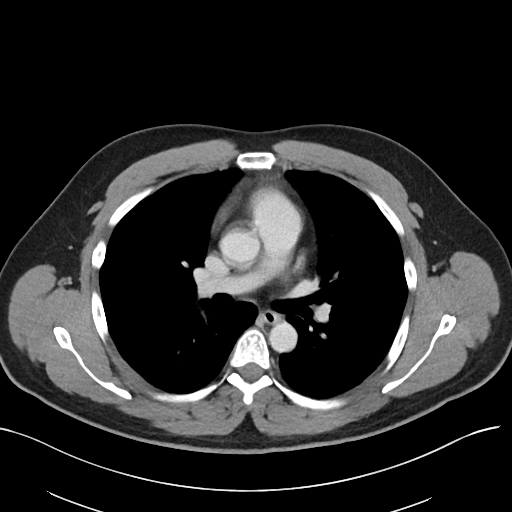
[im 84/100  soft-tissue]
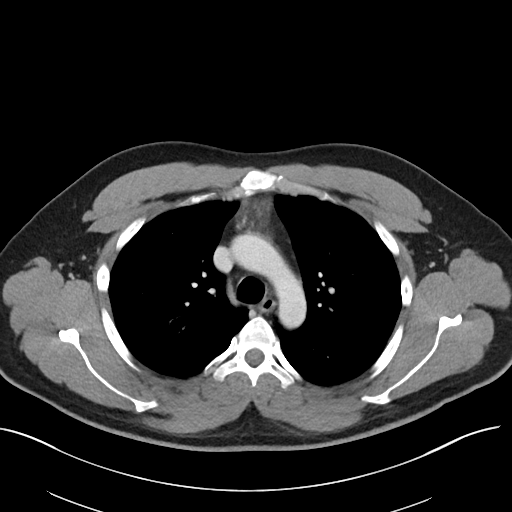
[im 92/100  soft-tissue]
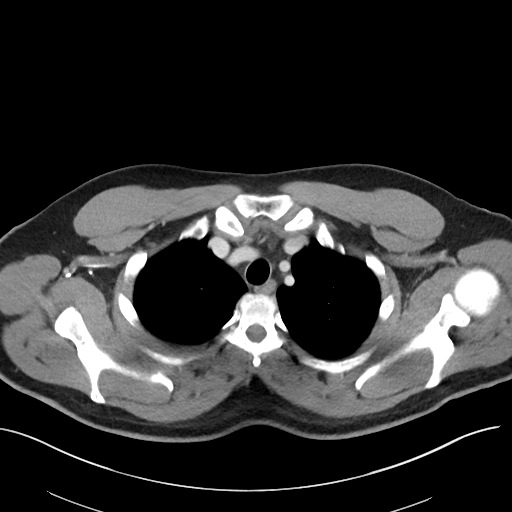

[Series 5: coronal · coronal · 0.93mm/px · 3 of 99 slices shown]
[im 33/99  soft-tissue]
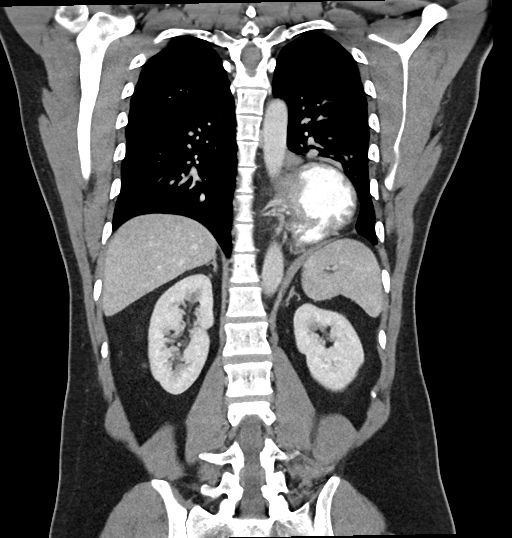
[im 44/99  soft-tissue]
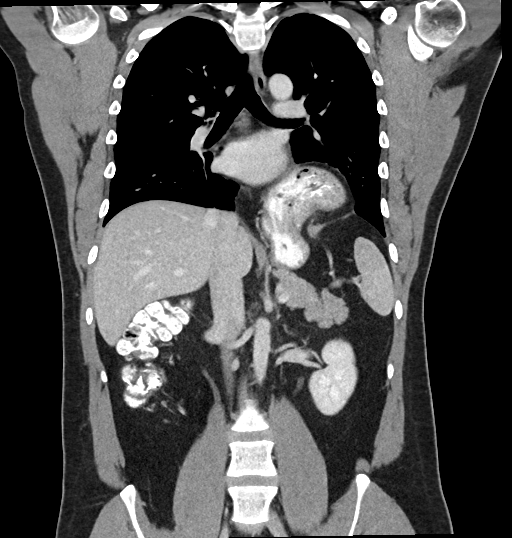
[im 55/99  soft-tissue]
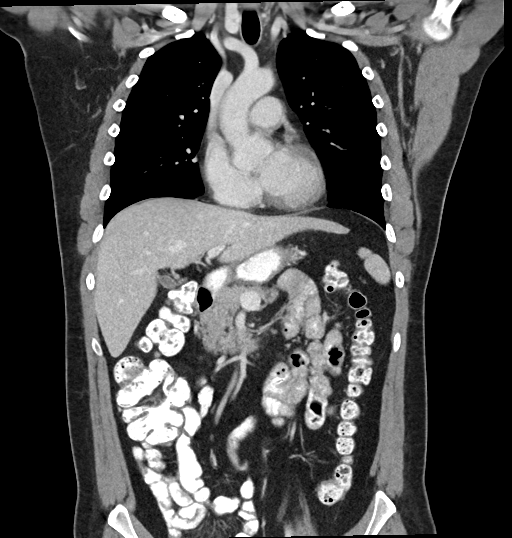

[15 of 46 positions shown; findings below may reference images not displayed]

RADIATION DOSE REDUCTION: This exam was performed according to the
departmental dose-optimization program which includes automated
exposure control, adjustment of the mA and/or kV according to
patient size and/or use of iterative reconstruction technique.

CONTRAST:  100mL OMNIPAQUE IOHEXOL 300 MG/ML  SOLN
FINDINGS: CT CHEST FINDINGS

Cardiovascular: Heart is normal size. Aorta is normal caliber.

Mediastinum/Nodes: No mediastinal, hilar, or axillary adenopathy.
Trachea and esophagus are unremarkable. Thyroid unremarkable.
Moderate to large hiatal hernia, likely a paraesophageal type.

Lungs/Pleura: Areas of atelectasis in the left lower lobe adjacent
to the hiatal hernia. Otherwise no confluent opacities or effusions.

Musculoskeletal: Chest wall soft tissues are unremarkable. No acute
bony abnormality.

CT ABDOMEN FINDINGS

Hepatobiliary: No focal hepatic abnormality. Gallbladder
unremarkable.

Pancreas: No focal abnormality or ductal dilatation.

Spleen: No focal abnormality.  Normal size.

Adrenals/Urinary Tract: No renal or adrenal mass. No hydronephrosis.

Stomach/Bowel: Stomach, visualized large and small bowel grossly
unremarkable.

Vascular/Lymphatic: No evidence of aneurysm or adenopathy.

Other: No free fluid or free air.

Musculoskeletal: No acute bony abnormality.
IMPRESSION: Moderate to large hiatal hernia, likely paraesophageal type.

Compressive atelectasis in the adjacent left lower lobe.

## 2022-03-04 ENCOUNTER — Other Ambulatory Visit: Payer: Self-pay | Admitting: Family Medicine

## 2022-06-02 ENCOUNTER — Telehealth: Payer: Self-pay | Admitting: Family Medicine

## 2022-06-02 DIAGNOSIS — E785 Hyperlipidemia, unspecified: Secondary | ICD-10-CM

## 2022-06-02 MED ORDER — ATORVASTATIN CALCIUM 20 MG PO TABS: 20.0000 mg | ORAL_TABLET | Freq: Every day | ORAL | 1 refills | Status: DC

## 2022-06-02 NOTE — Telephone Encounter (Signed)
Rx sent in for 90 +1 refill. Patient due for physical in 08/2022.

## 2022-06-02 NOTE — Telephone Encounter (Signed)
Pt called to request a refill of the  atorvastatin (LIPITOR) 20 MG tablet  [id number] - [place], [place] [address] Phone:  [phone]  Fax:  [phone]     LOV:  09/03/2021 = CPE  Pt was offered an OV and stated I should ask MD if he has to come in.

## 2022-08-15 ENCOUNTER — Encounter: Payer: Self-pay | Admitting: Internal Medicine

## 2022-08-15 ENCOUNTER — Ambulatory Visit: Payer: No Typology Code available for payment source | Admitting: Internal Medicine

## 2022-08-15 VITALS — BP 102/68 | HR 83 | Temp 98.0°F | Wt 220.4 lb

## 2022-08-15 DIAGNOSIS — S0502XA Injury of conjunctiva and corneal abrasion without foreign body, left eye, initial encounter: Secondary | ICD-10-CM | POA: Insufficient documentation

## 2022-08-15 DIAGNOSIS — S0502XD Injury of conjunctiva and corneal abrasion without foreign body, left eye, subsequent encounter: Secondary | ICD-10-CM

## 2022-08-15 MED ORDER — KETOROLAC TROMETHAMINE 0.4 % OP SOLN: 1.0000 [drp] | Freq: Four times a day (QID) | OPHTHALMIC | 0 refills | Status: DC

## 2022-08-15 NOTE — Progress Notes (Signed)
[facility]: [phone]   Routine Medical Office Visit  Patient:  Dalton Rogers      Age: 32 y.o.       Sex:  male  Date:   08/15/2022  PCP:    [place], Betty G, [place] Provider: Loralee Pacas, MD  Assessment/Plan:   Ekansh was seen today for follow-up.  Abrasion of left cornea, subsequent encounter -     Ketorolac Tromethamine; Place 1 drop into the left eye 4 (four) times daily.  Dispense: 5 mL; Refill: 0  I looked in his eye and it still a little bit injected and you can still see the abrasion on the cornea but he reports it has been greatly improved and he is taking the erythromycin ophthalmic ointment consistently and the hypertonic saline drops.  The injury was caused by scratch fingernail injury so it is high risk for infection and I explained that.  I therefore advised that he continue with the ointment and as long as it does not get worse he does not need by mouth antibiotics.  I sent in some ketorolac drops and advised him to use sunglasses because he needs to work on a computer but that is going to make the pain worse as light is still causing pain flares and may reduce or extend healing.  Since its improving we didn't repeat the woods lamp exam.  Today's key discussion points and After Visit Summary (AVS) reminders.  [x]   He was encouraged to contact our office by phone or message via MyChart if he has any questions or concerns regarding our treatment plan (see AVS). He was given an opportunity to ask questions/clarifications about any aspect of the diagnosis and treatment plan at today's visit. [x]   Common side effects, risks, benefits, and alternatives for medications and treatment plan prescribed today were discussed. He expressed understanding of the given instructions.  [x]   We discussed red flag symptoms and signs in detail (any worsening of symptom(s) due to that could be serious) and when to call the office or go to ER if his  condition worsens (see AVS). He expressed understanding and AVS is used to reinforce.  [x]  No barriers to understanding were identified    Subjective:   Dalton Rogers is a 32 y.o. male with the following chart data reviewed: Past Medical History:  Diagnosis Date   Anxiety    Hyperlipidemia    Outpatient Medications Prior to Visit  Medication Sig   atorvastatin (LIPITOR) 20 MG tablet Take 1 tablet (20 mg total) by mouth daily.   erythromycin ophthalmic ointment Apply to eye.   fluticasone (FLONASE) 50 MCG/ACT nasal spray Place 2 sprays into both nostrils in the morning and at bedtime.   omeprazole (PRILOSEC) 20 MG capsule Take 1 capsule (20 mg total) by mouth daily.   PARoxetine (PAXIL) 20 MG tablet TAKE 1 TABLET BY MOUTH EVERY DAY   No facility-administered medications prior to visit.     He presented today reporting reason for visit as: Chief Complaint  Patient presents with   Follow-up    For cornea abrasion. Son scratched his eye two days ago-was light sensitivity, pain, redness and nausea. Went to urgent care, has improved.     PROBLEM FOCUSED CHARTING  Based on chart review and/or medical interview, the problem list overview sections were updated today as follows: Problem  Left Corneal Abrasion        Objective:  Physical Exam  BP 102/68 (BP Location:  Left Arm, Patient Position: Sitting)   Pulse 83   Temp 98 F (36.7 C) (Temporal)   Wt 220 lb 6.4 oz (100 kg)   SpO2 98%   BMI 29.89 kg/m  Wt Readings from Last 10 Encounters:  08/15/22 220 lb 6.4 oz (100 kg)  11/20/21 210 lb (95.3 kg)  09/03/21 218 lb (98.9 kg)  08/26/21 221 lb (100.2 kg)  08/20/21 221 lb (100.2 kg)  07/11/21 219 lb 9.6 oz (99.6 kg)  07/09/21 215 lb (97.5 kg)  07/19/19 218 lb (98.9 kg)  04/06/19 224 lb 3.2 oz (101.7 kg)   Today's vital signs reviewed.  Nursing notes reviewed. Weight trend reviewed. General Appearance/Constitutional:   Overweight male according to BMI standardized data,  but waist circumference data should be used instead because BMI-based assessments do not account for lean muscle mass or the fact that only adipose tissue around the waist is unhealthy.  He is in no acute distress. Musculoskeletal: All extremities are intact.  Neurological:  Awake, alert,  No obvious focal neurological deficits or cognitive impairments Psychiatric:  Appropriate mood, pleasant demeanor Problem-specific findings:  mild left corneal injection and lower corneal abrasion.  No signs of secondary infection around the abrasion.    Results Reviewed:  No results found for any visits on 08/15/22.   No results found for this or any previous visit (from the past 2160 hour(s)).    Loralee Pacas, MD

## 2022-09-26 ENCOUNTER — Telehealth: Payer: Self-pay | Admitting: Family Medicine

## 2022-09-26 MED ORDER — PAROXETINE HCL 20 MG PO TABS: 20.0000 mg | ORAL_TABLET | Freq: Every day | ORAL | 0 refills | Status: DC

## 2022-09-26 NOTE — Progress Notes (Unsigned)
HPI: [name] is a 32 y.o. male, who is here today for chronic disease management.  Last seen on 09/03/21 No new problems since his last visit.  He reports that he is no longer taking omeprazole for acid reflux/GERD/hiatal hernia. S/p follow-up robotic paraesophageal hernia repair with prosthetic and EGD on 01/13/2022.  GI symptoms have resolved after procedure.  Anxiety: Currently he is on paroxetine 20 mg.  He admits to having poor adherence to his paroxetine medication but states that it is effective when taken regularly.  He attributes his recent increased stress to the birth of his second child in August/2023 and a promotion at work.  He expresses interest in exploring cognitive behavioral therapy.  He reports that his sleep has been disrupted due to his child' sleep pattern, averaging six to seven hours of interrupted sleep per night.  He is currently working from home, goes to his office once per week.  Hyperlipidemia: Currently he is on atorvastatin 20 mg He acknowledges that his diet is poor and that he has not been taking his cholesterol medication consistently, about 3-4 times per week.   Lab Results  Component Value Date   CHOL 143 09/03/2021   HDL 38.00 (L) 09/03/2021   LDLCALC 93 09/03/2021   TRIG 63.0 09/03/2021   CHOLHDL 4 09/03/2021   Prediabetes: Negative for polyphagia, polydipsia, and polyphagia. He has not been consistent with following a healthful diet and not exercising regularly due to work schedule.  Lab Results  Component Value Date   HGBA1C 5.9 09/03/2021   Lastly, he inquires about obtaining a referral for a vasectomy and is unsure about the insurance requirements.  Review of Systems  Constitutional:  Positive for fatigue. Negative for chills and fever.  HENT:  Negative for nosebleeds and sore throat.   Respiratory:  Negative for cough, shortness of breath and wheezing.   Cardiovascular:  Negative for chest pain and palpitations.   Gastrointestinal:  Negative for abdominal pain, nausea and vomiting.  Endocrine: Negative for cold intolerance and heat intolerance.  Genitourinary:  Negative for decreased urine volume and hematuria.  Skin:  Negative for rash.  Neurological:  Negative for syncope and headaches.  Psychiatric/Behavioral:  Negative for confusion and hallucinations.   See other pertinent positives and negatives in HPI.  Current Outpatient Medications on File Prior to Visit  Medication Sig Dispense Refill   atorvastatin (LIPITOR) 20 MG tablet Take 1 tablet (20 mg total) by mouth daily. 90 tablet 1   No current facility-administered medications on file prior to visit.   Past Medical History:  Diagnosis Date   Anxiety    Hyperlipidemia    No Known Allergies  Social History   Socioeconomic History   Marital status: Married    Spouse name: Not on file   Number of children: Not on file   Years of education: Not on file   Highest education level: Not on file  Occupational History   Not on file  Tobacco Use   Smoking status: Never   Smokeless tobacco: Never  Vaping Use   Vaping Use: Never used  Substance and Sexual Activity   Alcohol use: Yes    Comment: ocassionally   Drug use: Not Currently   Sexual activity: Yes  Other Topics Concern   Not on file  Social History Narrative   Not on file   Social Determinants of Health   Financial Resource Strain: Not on file  Food Insecurity: Not on file  Transportation Needs: Not on  file  Physical Activity: Not on file  Stress: Not on file  Social Connections: Not on file   Vitals:   09/29/22 0727  BP: 126/80  Pulse: 90  Resp: 12  Temp: 98.7 F (37.1 C)  SpO2: 98%  Body mass index is 29.91 kg/m.  Physical Exam Vitals and nursing note reviewed.  Constitutional:      General: He is not in acute distress.    Appearance: He is well-developed.  HENT:     Head: Normocephalic and atraumatic.     Mouth/Throat:     Mouth: Mucous membranes  are moist.     Pharynx: Oropharynx is clear.  Eyes:     Conjunctiva/sclera: Conjunctivae normal.  Cardiovascular:     Rate and Rhythm: Normal rate and regular rhythm.     Pulses:          Dorsalis pedis pulses are 2+ on the right side and 2+ on the left side.     Heart sounds: No murmur heard. Pulmonary:     Effort: Pulmonary effort is normal. No respiratory distress.     Breath sounds: Normal breath sounds.  Abdominal:     Palpations: Abdomen is soft. There is no mass.     Tenderness: There is no abdominal tenderness.  Skin:    General: Skin is warm.     Findings: No erythema or rash.  Neurological:     Mental Status: He is alert and oriented to person, place, and time.     Cranial Nerves: No cranial nerve deficit.     Gait: Gait normal.  Psychiatric:        Mood and Affect: Mood and affect normal.   ASSESSMENT AND PLAN:  Mr.Friend was seen today for medical management of chronic issues.  Diagnoses and all orders for this visit:  Hyperlipidemia, unspecified hyperlipidemia type Assessment & Plan: For now continue Atorvastatin 20 mg daily. He is forgetting to take medication 2-3 times per week. Low fat diet also recommended. Further recommendations according to FLP results.  Orders: -     Comprehensive metabolic panel; Future -     Lipid panel; Future  Anxiety disorder, unspecified type Assessment & Plan: Problem is stable overall. He will try to improve adherence to medication. Continue Paxil 20 mg daily. CBT will also help, he is planning on establishing with psychotherapist. As far as problem is stable, annual follow up can be continued.  Orders: -     PARoxetine HCl; Take 1 tablet (20 mg total) by mouth daily.  Dispense: 90 tablet; Refill: 3  Prediabetes Assessment & Plan: A healthier lifestyle encouraged for diabetes prevention. Further recommendation will be given according to hemoglobin A1c result.  Orders: -     Hemoglobin A1c; Future  Encounter for  contraceptive management, unspecified type -     Ambulatory referral to Urology  Return in about 1 year (around 09/30/2023) for CPE.  Arushi Partridge G. [place], MD  [facility]. [place] office.

## 2022-09-26 NOTE — Telephone Encounter (Signed)
Rx sent in

## 2022-09-26 NOTE — Telephone Encounter (Signed)
Pt has only one pill left. Pt has an appt on 09-29-2022 and would like a refill on PARoxetine (PAXIL) 20 MG tablet  [id number] [place],  - [address]: [phone] Fax: [phone]

## 2022-09-29 ENCOUNTER — Encounter: Payer: Self-pay | Admitting: Family Medicine

## 2022-09-29 ENCOUNTER — Ambulatory Visit: Payer: No Typology Code available for payment source | Admitting: Family Medicine

## 2022-09-29 VITALS — BP 126/80 | HR 90 | Temp 98.7°F | Resp 12 | Ht 72.0 in | Wt 220.5 lb

## 2022-09-29 DIAGNOSIS — F419 Anxiety disorder, unspecified: Secondary | ICD-10-CM | POA: Diagnosis not present

## 2022-09-29 DIAGNOSIS — Z309 Encounter for contraceptive management, unspecified: Secondary | ICD-10-CM | POA: Diagnosis not present

## 2022-09-29 DIAGNOSIS — R7303 Prediabetes: Secondary | ICD-10-CM | POA: Diagnosis not present

## 2022-09-29 DIAGNOSIS — E785 Hyperlipidemia, unspecified: Secondary | ICD-10-CM | POA: Diagnosis not present

## 2022-09-29 LAB — COMPREHENSIVE METABOLIC PANEL
ALT: 49 U/L (ref 0–53)
AST: 31 U/L (ref 0–37)
Albumin: 4.5 g/dL (ref 3.5–5.2)
Alkaline Phosphatase: 84 U/L (ref 39–117)
BUN: 14 mg/dL (ref 6–23)
CO2: 30 mEq/L (ref 19–32)
Calcium: 9.8 mg/dL (ref 8.4–10.5)
Chloride: 103 mEq/L (ref 96–112)
Creatinine, Ser: 1.06 mg/dL (ref 0.40–1.50)
GFR: 93.07 mL/min (ref >60.00)
Glucose, Bld: 91 mg/dL (ref 70–99)
Potassium: 4 mEq/L (ref 3.5–5.1)
Sodium: 141 mEq/L (ref 135–145)
Total Bilirubin: 0.5 mg/dL (ref 0.2–1.2)
Total Protein: 7.2 g/dL (ref 6.0–8.3)

## 2022-09-29 LAB — LIPID PANEL
Cholesterol: 167 mg/dL (ref 0–200)
HDL: 47.3 mg/dL (ref >39.00)
LDL Cholesterol: 99 mg/dL (ref 0–99)
NonHDL: 119.84
Total CHOL/HDL Ratio: 4
Triglycerides: 105 mg/dL (ref 0.0–149.0)
VLDL: 21 mg/dL (ref 0.0–40.0)

## 2022-09-29 LAB — HEMOGLOBIN A1C: Hgb A1c MFr Bld: 5.9 % (ref 4.6–6.5)

## 2022-09-29 MED ORDER — PAROXETINE HCL 20 MG PO TABS: 20.0000 mg | ORAL_TABLET | Freq: Every day | ORAL | 3 refills | Status: DC

## 2022-09-29 NOTE — Assessment & Plan Note (Signed)
A healthier lifestyle encouraged for diabetes prevention. Further recommendation will be given according to hemoglobin A1c result.

## 2022-09-29 NOTE — Assessment & Plan Note (Signed)
For now continue Atorvastatin 20 mg daily. He is forgetting to take medication 2-3 times per week. Low fat diet also recommended. Further recommendations according to FLP results.

## 2022-09-29 NOTE — Patient Instructions (Addendum)
A few things to remember from today's visit:  Hyperlipidemia, unspecified hyperlipidemia type - Plan: Comprehensive metabolic panel, Lipid panel  Anxiety disorder, unspecified type  Prediabetes - Plan: Hemoglobin A1c  No changes today. Try to be more consistent with taking medications. As for as numbers are stable we can continue annual follow up, before if needed.  If you need refills for medications you take chronically, please call your pharmacy. Do not use My Chart to request refills or for acute issues that need immediate attention. If you send a my chart message, it may take a few days to be addressed, specially if I am not in the office.  Please be sure medication list is accurate. If a new problem present, please set up appointment sooner than planned today.

## 2022-09-29 NOTE — Assessment & Plan Note (Signed)
Problem is stable overall. He will try to improve adherence to medication. Continue Paxil 20 mg daily. CBT will also help, he is planning on establishing with psychotherapist. As far as problem is stable, annual follow up can be continued.

## 2022-11-18 ENCOUNTER — Ambulatory Visit: Payer: No Typology Code available for payment source | Admitting: Family

## 2022-11-18 ENCOUNTER — Encounter: Payer: Self-pay | Admitting: Family

## 2022-11-18 VITALS — BP 120/80 | HR 95 | Temp 98.8°F | Ht 72.0 in | Wt 213.6 lb

## 2022-11-18 DIAGNOSIS — B9689 Other specified bacterial agents as the cause of diseases classified elsewhere: Secondary | ICD-10-CM

## 2022-11-18 DIAGNOSIS — J40 Bronchitis, not specified as acute or chronic: Secondary | ICD-10-CM

## 2022-11-18 DIAGNOSIS — J329 Chronic sinusitis, unspecified: Secondary | ICD-10-CM | POA: Diagnosis not present

## 2022-11-18 MED ORDER — AMOXICILLIN-POT CLAVULANATE 875-125 MG PO TABS: 1.0000 | ORAL_TABLET | Freq: Two times a day (BID) | ORAL | 0 refills | Status: DC

## 2022-11-18 MED ORDER — PREDNISONE 20 MG PO TABS: 40.0000 mg | ORAL_TABLET | Freq: Every day | ORAL | 0 refills | Status: DC

## 2022-11-18 NOTE — Progress Notes (Signed)
Acute Office Visit  Subjective:     Patient ID: Dalton Rogers, male    DOB: 10-07-1990, 32 y.o.   MRN: [id number]  Chief Complaint  Patient presents with  . Sinusitis    Patient complains of sinusitis, x1 week, Treid Zyrtec and OTC decongestant with little relief   . Cough    Patient complains of cough, Productive, x1 month   . Chills    Patient complains of chills, x1 month     HPI Patient is in today with complaints of intense sinus pressure, congestion, deep barky cough, chills at night that has been ongoing x 1 month.  He has been taken over-the-counter Zyrtec and decongestants without much relief.  He has 2 children at home that typically rotate illness.  Wife is sick with similar symptoms.  One of his children has been treated with antibiotics recently.  Review of Systems  Constitutional:  Positive for chills and malaise/fatigue.  HENT:  Positive for congestion and sinus pain.   Respiratory:  Positive for cough and wheezing.   All other systems reviewed and are negative. Past Medical History:  Diagnosis Date  . Anxiety   . Hyperlipidemia     Social History   Socioeconomic History  . Marital status: Married    Spouse name: Not on file  . Number of children: Not on file  . Years of education: Not on file  . Highest education level: Not on file  Occupational History  . Not on file  Tobacco Use  . Smoking status: Never  . Smokeless tobacco: Never  Vaping Use  . Vaping Use: Never used  Substance and Sexual Activity  . Alcohol use: Yes    Comment: ocassionally  . Drug use: Not Currently  . Sexual activity: Yes  Other Topics Concern  . Not on file  Social History Narrative  . Not on file   Social Determinants of Health   Financial Resource Strain: Not on file  Food Insecurity: Not on file  Transportation Needs: Not on file  Physical Activity: Not on file  Stress: Not on file  Social Connections: Not on file  Intimate Partner Violence: Not on file     Past Surgical History:  Procedure Laterality Date  . hyperlipidemia      Family History  Problem Relation Age of Onset  . Heart disease Father   . Hyperlipidemia Father   . Hypertension Father   . Colon cancer Neg Hx   . Colon polyps Neg Hx   . Rectal cancer Neg Hx     No Known Allergies  Current Outpatient Medications on File Prior to Visit  Medication Sig Dispense Refill  . atorvastatin (LIPITOR) 20 MG tablet Take 1 tablet (20 mg total) by mouth daily. 90 tablet 1  . PARoxetine (PAXIL) 20 MG tablet Take 1 tablet (20 mg total) by mouth daily. 90 tablet 3   No current facility-administered medications on file prior to visit.    BP 120/80 (BP Location: Left Arm, Patient Position: Sitting, Cuff Size: Large)   Pulse 95   Temp 98.8 F (37.1 C) (Oral)   Ht 6' (1.829 m)   Wt 213 lb 9.6 oz (96.9 kg)   SpO2 97%   BMI 28.97 kg/m chart      Objective:    BP 120/80 (BP Location: Left Arm, Patient Position: Sitting, Cuff Size: Large)   Pulse 95   Temp 98.8 F (37.1 C) (Oral)   Ht 6' (1.829 m)   Wt  213 lb 9.6 oz (96.9 kg)   SpO2 97%   BMI 28.97 kg/m    Physical Exam Vitals reviewed.  Constitutional:      Appearance: Normal appearance.  HENT:     Right Ear: Tympanic membrane, ear canal and external ear normal.     Left Ear: Tympanic membrane, ear canal and external ear normal.     Nose: Congestion present.     Comments: Ethmoid sinus tenderness to palpation    Mouth/Throat:     Mouth: Mucous membranes are moist.     Pharynx: No oropharyngeal exudate.  Cardiovascular:     Rate and Rhythm: Normal rate and regular rhythm.  Pulmonary:     Effort: Pulmonary effort is normal. No respiratory distress.     Breath sounds: Normal breath sounds. No wheezing.  Abdominal:     General: Abdomen is flat.     Tenderness: There is no rebound.  Musculoskeletal:        General: Normal range of motion.     Cervical back: Neck supple.  Skin:    General: Skin is warm and  dry.  Neurological:     General: No focal deficit present.     Mental Status: He is alert and oriented to person, place, and time.  Psychiatric:        Mood and Affect: Mood normal.        Behavior: Behavior normal.   No results found for any visits on 11/18/22.      Assessment & Plan:   Problem List Items Addressed This Visit   None Visit Diagnoses     Bronchitis, not specified as acute or chronic    -  Primary   Bacterial sinusitis       Relevant Medications   amoxicillin-clavulanate (AUGMENTIN) 875-125 MG tablet   predniSONE (DELTASONE) 20 MG tablet       Meds ordered this encounter  Medications  . amoxicillin-clavulanate (AUGMENTIN) 875-125 MG tablet    Sig: Take 1 tablet by mouth 2 (two) times daily.    Dispense:  20 tablet    Refill:  0  . predniSONE (DELTASONE) 20 MG tablet    Sig: Take 2 tablets (40 mg total) by mouth daily with breakfast.    Dispense:  10 tablet    Refill:  0  Call the office if symptoms worsen or persist.  Recheck as scheduled and sooner as needed.  No follow-ups on file.  Eulis Foster, FNP

## 2023-01-21 ENCOUNTER — Other Ambulatory Visit: Payer: Self-pay | Admitting: Family Medicine

## 2023-01-21 DIAGNOSIS — E785 Hyperlipidemia, unspecified: Secondary | ICD-10-CM

## 2023-09-30 ENCOUNTER — Other Ambulatory Visit: Payer: Self-pay | Admitting: Family Medicine

## 2023-09-30 DIAGNOSIS — F419 Anxiety disorder, unspecified: Secondary | ICD-10-CM

## 2023-09-30 NOTE — Telephone Encounter (Signed)
 Called patient to sch CPE for 2025, refill has been sent over to the pharmacy

## 2023-10-18 ENCOUNTER — Other Ambulatory Visit: Payer: Self-pay | Admitting: Family Medicine

## 2023-10-18 DIAGNOSIS — E785 Hyperlipidemia, unspecified: Secondary | ICD-10-CM

## 2023-11-04 ENCOUNTER — Ambulatory Visit (INDEPENDENT_AMBULATORY_CARE_PROVIDER_SITE_OTHER)

## 2023-11-04 ENCOUNTER — Encounter: Payer: Self-pay | Admitting: Family Medicine

## 2023-11-04 ENCOUNTER — Ambulatory Visit (INDEPENDENT_AMBULATORY_CARE_PROVIDER_SITE_OTHER): Admitting: Family Medicine

## 2023-11-04 VITALS — BP 116/80 | HR 76 | Temp 98.7°F | Resp 12 | Ht 72.0 in | Wt 206.0 lb

## 2023-11-04 DIAGNOSIS — E538 Deficiency of other specified B group vitamins: Secondary | ICD-10-CM

## 2023-11-04 DIAGNOSIS — Z130 Encounter for screening for diseases of the blood and blood-forming organs and certain disorders involving the immune mechanism: Secondary | ICD-10-CM

## 2023-11-04 DIAGNOSIS — E559 Vitamin D deficiency, unspecified: Secondary | ICD-10-CM

## 2023-11-04 DIAGNOSIS — Z13228 Encounter for screening for other metabolic disorders: Secondary | ICD-10-CM

## 2023-11-04 DIAGNOSIS — Z0000 Encounter for general adult medical examination without abnormal findings: Secondary | ICD-10-CM | POA: Diagnosis not present

## 2023-11-04 DIAGNOSIS — E785 Hyperlipidemia, unspecified: Secondary | ICD-10-CM | POA: Diagnosis not present

## 2023-11-04 DIAGNOSIS — Z1329 Encounter for screening for other suspected endocrine disorder: Secondary | ICD-10-CM

## 2023-11-04 DIAGNOSIS — F419 Anxiety disorder, unspecified: Secondary | ICD-10-CM | POA: Diagnosis not present

## 2023-11-04 LAB — COMPREHENSIVE METABOLIC PANEL WITH GFR
ALT: 34 U/L (ref 0–53)
AST: 20 U/L (ref 0–37)
Albumin: 4.8 g/dL (ref 3.5–5.2)
Alkaline Phosphatase: 83 U/L (ref 39–117)
BUN: 14 mg/dL (ref 6–23)
CO2: 32 meq/L (ref 19–32)
Calcium: 10 mg/dL (ref 8.4–10.5)
Chloride: 103 meq/L (ref 96–112)
Creatinine, Ser: 0.96 mg/dL (ref 0.40–1.50)
GFR: 104.02 mL/min (ref >60.00)
Glucose, Bld: 93 mg/dL (ref 70–99)
Potassium: 4.6 meq/L (ref 3.5–5.1)
Sodium: 140 meq/L (ref 135–145)
Total Bilirubin: 0.4 mg/dL (ref 0.2–1.2)
Total Protein: 7.2 g/dL (ref 6.0–8.3)

## 2023-11-04 LAB — LIPID PANEL
Cholesterol: 215 mg/dL — ABNORMAL HIGH (ref 0–200)
HDL: 46.7 mg/dL (ref >39.00)
LDL Cholesterol: 146 mg/dL — ABNORMAL HIGH (ref 0–99)
NonHDL: 167.8
Total CHOL/HDL Ratio: 5
Triglycerides: 108 mg/dL (ref 0.0–149.0)
VLDL: 21.6 mg/dL (ref 0.0–40.0)

## 2023-11-04 LAB — HEMOGLOBIN A1C: Hgb A1c MFr Bld: 6 % (ref 4.6–6.5)

## 2023-11-04 MED ORDER — PAROXETINE HCL 20 MG PO TABS: 20.0000 mg | ORAL_TABLET | Freq: Every day | ORAL | 3 refills | Status: AC

## 2023-11-04 MED ORDER — ATORVASTATIN CALCIUM 20 MG PO TABS: 20.0000 mg | ORAL_TABLET | Freq: Every day | ORAL | 3 refills | Status: AC

## 2023-11-04 NOTE — Assessment & Plan Note (Signed)
 He is not on vitamin D supplementation. Further recommendation will be given according to 25 OH vitamin D result.

## 2023-11-04 NOTE — Assessment & Plan Note (Signed)
 He is not on B12 supplementation. Further recommendation will be given according to B12 result.

## 2023-11-04 NOTE — Assessment & Plan Note (Signed)
 Continue atorvastatin 20 mg daily and low-fat diet. Further recommendation will be given according to FLP results.

## 2023-11-04 NOTE — Assessment & Plan Note (Signed)
 Problem has been stable. Continue paroxetine 20 mg daily and CBT monthly. If at any point he feels like he wants to decrease or wean off paroxetine, he will let me know. Continue annual follow-up, before if needed.

## 2023-11-04 NOTE — Progress Notes (Unsigned)
 HPI: [name] is a 33 y.o.male  with PMHx significant for  hyperlipidemia, prediabetes, and anxiety, here today for his routine physical examination.  Last CPE: 09/03/2021  Exercise: He has not been exercising regularly. Diet: Home cooked meals. Has cut back on sweets and sodas.  Sleep: 8 hours a night.  Smoking: Never used.  Alcohol consumption: 1 drink so far this year. Cut back on alcohol at the start of the year.  Dental: UTD with routine dental cleanings.  Vision:  N/A  Chronic medical problems:   Hyperlipidemia: Currently on Atorvastatin 20 mg daily.  In the last 2-3 weeks he has been taking Atorvastatin every 2-3 days to ration this medication before running out.  Following a low fat diet: Eating home cooked meals, but not following a low fat restrictive diet.  Has a family history of HLD.  Lab Results  Component Value Date   CHOL 167 09/29/2022   HDL 47.30 09/29/2022   LDLCALC 99 09/29/2022   TRIG 105.0 09/29/2022   CHOLHDL 4 09/29/2022   Anxiety: Moods are stable currently.  Pt is on Paxil 20 mg once daily.  He reports withdrawal symptoms when he missed at least 2 dose. He adds this is recently and he is usually more compliant with his medications.  He is currently following up with his therapist once monthly. When he started talk therapist he followed up biweekly.   B12 and vit D deficiency: He is not on vitamin B12 and vitamin D supplementation. Lab Results  Component Value Date   VD25OH 45.45 07/19/2019   Lab Results  Component Value Date   HGBA1C 5.9 09/29/2022   Immunization History  Administered Date(s) Administered   Influenza,inj,Quad PF,6+ Mos 07/19/2019    Health Maintenance  Topic Date Due   INFLUENZA VACCINE  11/09/2023 (Originally 03/12/2023)   COVID-19 Vaccine (1 - 2024-25 season) 11/20/2023 (Originally 04/12/2023)   HIV Screening  07/18/2024 (Originally 07/23/2005)   Hepatitis C Screening  Completed   HPV VACCINES  Aged Out    DTaP/Tdap/Td  Discontinued   -Concerns and/or follow up today:  He reports he recently had a vasectomy on 10/09/2023.   Review of Systems  Constitutional:  Negative for activity change, appetite change and fever.  HENT:  Negative for nosebleeds, sore throat and trouble swallowing.   Eyes:  Negative for redness and visual disturbance.  Respiratory:  Negative for cough, shortness of breath and wheezing.   Cardiovascular:  Negative for chest pain, palpitations and leg swelling.  Gastrointestinal:  Negative for abdominal pain, nausea and vomiting.  Endocrine: Negative for cold intolerance, heat intolerance, polydipsia, polyphagia and polyuria.  Genitourinary:  Negative for decreased urine volume, dysuria, genital sores, hematuria and testicular pain.  Musculoskeletal:  Negative for gait problem and myalgias.  Skin:  Negative for color change and rash.  Allergic/Immunologic: Negative for environmental allergies.  Neurological:  Negative for syncope, weakness and headaches.  Hematological:  Negative for adenopathy. Does not bruise/bleed easily.  Psychiatric/Behavioral:  Negative for confusion, hallucinations and sleep disturbance.   All other systems reviewed and are negative.  No current outpatient medications on file prior to visit.   No current facility-administered medications on file prior to visit.   Past Medical History:  Diagnosis Date   Anxiety    Hyperlipidemia     Past Surgical History:  Procedure Laterality Date   hyperlipidemia     No Known Allergies  Family History  Problem Relation Age of Onset   Heart disease Father  Hyperlipidemia Father    Hypertension Father    Colon cancer Neg Hx    Colon polyps Neg Hx    Rectal cancer Neg Hx     Social History   Socioeconomic History   Marital status: Married    Spouse name: Not on file   Number of children: Not on file   Years of education: Not on file   Highest education level: Not on file  Occupational  History   Not on file  Tobacco Use   Smoking status: Never   Smokeless tobacco: Never  Vaping Use   Vaping status: Never Used  Substance and Sexual Activity   Alcohol use: Yes    Comment: ocassionally   Drug use: Not Currently   Sexual activity: Yes  Other Topics Concern   Not on file  Social History Narrative   Not on file   Social Drivers of Health   Financial Resource Strain: Not on file  Food Insecurity: Not on file  Transportation Needs: Not on file  Physical Activity: Not on file  Stress: Not on file  Social Connections: Not on file   Vitals:   11/04/23 0934  BP: 116/80  Pulse: 76  Resp: 12  Temp: 98.7 F (37.1 C)  SpO2: 98%   Body mass index is 27.94 kg/m.  Wt Readings from Last 3 Encounters:  11/04/23 206 lb (93.4 kg)  11/18/22 213 lb 9.6 oz (96.9 kg)  09/29/22 220 lb 8 oz (100 kg)   Physical Exam Vitals and nursing note reviewed.  Constitutional:      General: He is not in acute distress.    Appearance: He is well-developed.  HENT:     Head: Normocephalic and atraumatic.     Right Ear: Tympanic membrane, ear canal and external ear normal.     Left Ear: Tympanic membrane, ear canal and external ear normal.  Eyes:     Extraocular Movements: Extraocular movements intact.     Conjunctiva/sclera: Conjunctivae normal.     Pupils: Pupils are equal, round, and reactive to light.  Neck:     Thyroid: No thyroid mass or thyromegaly.  Cardiovascular:     Rate and Rhythm: Normal rate and regular rhythm.     Pulses:          Dorsalis pedis pulses are 2+ on the right side and 2+ on the left side.     Heart sounds: No murmur heard. Pulmonary:     Effort: Pulmonary effort is normal. No respiratory distress.     Breath sounds: Normal breath sounds.  Abdominal:     Palpations: Abdomen is soft. There is no hepatomegaly or mass.     Tenderness: There is no abdominal tenderness.  Genitourinary:    Comments: No concerns. Musculoskeletal:        General: No  tenderness.     Cervical back: Normal range of motion.     Comments: No major deformities appreciated and no signs of synovitis.  Lymphadenopathy:     Cervical: No cervical adenopathy.     Upper Body:     Right upper body: No supraclavicular adenopathy.     Left upper body: No supraclavicular adenopathy.  Skin:    General: Skin is warm.     Findings: No erythema.  Neurological:     General: No focal deficit present.     Mental Status: He is alert and oriented to person, place, and time.     Cranial Nerves: No cranial nerve deficit.  Sensory: No sensory deficit.     Gait: Gait normal.     Deep Tendon Reflexes:     Reflex Scores:      Bicep reflexes are 2+ on the right side and 2+ on the left side.      Patellar reflexes are 2+ on the right side and 2+ on the left side. Psychiatric:        Mood and Affect: Mood and affect normal.   ASSESSMENT AND PLAN: [name] was seen today for comprehensive physical exam.  Orders Placed This Encounter  Procedures   Comprehensive metabolic panel   Lipid panel   Hemoglobin A1c   Vitamin B12   VITAMIN D 25 Hydroxy (Vit-D Deficiency, Fractures)   Lab Results  Component Value Date   VD25OH 26.76 (L) 11/04/2023   Lab Results  Component Value Date   ALT 34 11/04/2023   AST 20 11/04/2023   ALKPHOS 83 11/04/2023   BILITOT 0.4 11/04/2023   Lab Results  Component Value Date   NA 140 11/04/2023   CL 103 11/04/2023   K 4.6 11/04/2023   CO2 32 11/04/2023   BUN 14 11/04/2023   CREATININE 0.96 11/04/2023   GFR 104.02 11/04/2023   CALCIUM 10.0 11/04/2023   ALBUMIN 4.8 11/04/2023   GLUCOSE 93 11/04/2023   Lab Results  Component Value Date   CHOL 215 (H) 11/04/2023   HDL 46.70 11/04/2023   LDLCALC 146 (H) 11/04/2023   TRIG 108.0 11/04/2023   CHOLHDL 5 11/04/2023   Lab Results  Component Value Date   HGBA1C 6.0 11/04/2023   Lab Results  Component Value Date   VITAMINB12 280 11/04/2023   Routine general medical  examination at a health care facility Assessment & Plan: We discussed the importance of regular physical activity and healthy diet for prevention of chronic illness and/or complications. Preventive guidelines reviewed. Vaccination up to date. Next CPE in a year.   Screening for endocrine, metabolic and immunity disorder -     Hemoglobin A1c; Future  Hyperlipidemia, unspecified hyperlipidemia type Assessment & Plan: Continue atorvastatin 20 mg daily and low-fat diet. Further recommendation will be given according to FLP results.  Orders: -     Comprehensive metabolic panel; Future -     Lipid panel; Future -     Atorvastatin Calcium; Take 1 tablet (20 mg total) by mouth daily. Due for physical in order to get more refills. [phone]  Dispense: 90 tablet; Refill: 3  Anxiety disorder, unspecified type Assessment & Plan: Problem has been stable. Continue paroxetine 20 mg daily and CBT monthly. If at any point he feels like he wants to decrease or wean off paroxetine, he will let me know. Continue annual follow-up, before if needed.  Orders: -     PARoxetine HCl; Take 1 tablet (20 mg total) by mouth daily.  Dispense: 90 tablet; Refill: 3  B12 deficiency Assessment & Plan: He is not on B12 supplementation. Further recommendation will be given according to B12 result.  Orders: -     Vitamin B12; Future  Vitamin D deficiency, unspecified Assessment & Plan: He is not on vitamin D supplementation. Further recommendation will be given according to 25 [place] vitamin D result.  Orders: -     VITAMIN D 25 Hydroxy (Vit-D Deficiency, Fractures); Future  Return in 1 year (on 11/03/2024) for CPE, Labs.  I, Isabelle Course, acting as a scribe for Tityana Pagan [place], MD., have documented all relevant documentation on the behalf of Dazhane Villagomez [place],  MD, as directed by  Natarsha Hurwitz [place], MD while in the presence of Enrique Weiss [place], MD.  I, Michaila Kenney [place], MD, have reviewed all documentation for this visit. The  documentation on 11/04/23 for the exam, diagnosis, procedures, and orders are all accurate and complete.  Kathleene Bergemann G. [place], MD  [facility] [place] office.

## 2023-11-04 NOTE — Assessment & Plan Note (Signed)
We discussed the importance of regular physical activity and healthy diet for prevention of chronic illness and/or complications. Preventive guidelines reviewed. Vaccination up to date. Next CPE in a year. 

## 2023-11-04 NOTE — Patient Instructions (Addendum)
 A few things to remember from today's visit:  Routine general medical examination at a health care facility  Screening for endocrine, metabolic and immunity disorder - Plan: Hemoglobin A1c  Hyperlipidemia, unspecified hyperlipidemia type - Plan: Comprehensive metabolic panel, Lipid panel, atorvastatin (LIPITOR) 20 MG tablet  Anxiety disorder, unspecified type - Plan: PARoxetine (PAXIL) 20 MG tablet  B12 deficiency - Plan: Vitamin B12  No changes today. If you need refills for medications you take chronically, please call your pharmacy. Do not use My Chart to request refills or for acute issues that need immediate attention. If you send a my chart message, it may take a few days to be addressed, specially if I am not in the office.  Please be sure medication list is accurate. If a new problem present, please set up appointment sooner than planned today.  Health Maintenance, Male Adopting a healthy lifestyle and getting preventive care are important in promoting health and wellness. Ask your health care provider about: The right schedule for you to have regular tests and exams. Things you can do on your own to prevent diseases and keep yourself healthy. What should I know about diet, weight, and exercise? Eat a healthy diet  Eat a diet that includes plenty of vegetables, fruits, low-fat dairy products, and lean protein. Do not eat a lot of foods that are high in solid fats, added sugars, or sodium. Maintain a healthy weight Body mass index (BMI) is a measurement that can be used to identify possible weight problems. It estimates body fat based on height and weight. Your health care provider can help determine your BMI and help you achieve or maintain a healthy weight. Get regular exercise Get regular exercise. This is one of the most important things you can do for your health. Most adults should: Exercise for at least 150 minutes each week. The exercise should increase your heart rate  and make you sweat (moderate-intensity exercise). Do strengthening exercises at least twice a week. This is in addition to the moderate-intensity exercise. Spend less time sitting. Even light physical activity can be beneficial. Watch cholesterol and blood lipids Have your blood tested for lipids and cholesterol at 34 years of age, then have this test every 5 years. You may need to have your cholesterol levels checked more often if: Your lipid or cholesterol levels are high. You are older than 34 years of age. You are at high risk for heart disease. What should I know about cancer screening? Many types of cancers can be detected early and may often be prevented. Depending on your health history and family history, you may need to have cancer screening at various ages. This may include screening for: Colorectal cancer. Prostate cancer. Skin cancer. Lung cancer. What should I know about heart disease, diabetes, and high blood pressure? Blood pressure and heart disease High blood pressure causes heart disease and increases the risk of stroke. This is more likely to develop in people who have high blood pressure readings or are overweight. Talk with your health care provider about your target blood pressure readings. Have your blood pressure checked: Every 3-5 years if you are 11-30 years of age. Every year if you are 56 years old or older. If you are between the ages of 34 and 52 and are a current or former smoker, ask your health care provider if you should have a one-time screening for abdominal aortic aneurysm (AAA). Diabetes Have regular diabetes screenings. This checks your fasting blood sugar level. Have  the screening done: Once every three years after age 51 if you are at a normal weight and have a low risk for diabetes. More often and at a younger age if you are overweight or have a high risk for diabetes. What should I know about preventing infection? Hepatitis B If you have a  higher risk for hepatitis B, you should be screened for this virus. Talk with your health care provider to find out if you are at risk for hepatitis B infection. Hepatitis C Blood testing is recommended for: Everyone born from [birth year] through 1966. Anyone with known risk factors for hepatitis C. Sexually transmitted infections (STIs) You should be screened each year for STIs, including gonorrhea and chlamydia, if: You are sexually active and are younger than 34 years of age. You are older than 34 years of age and your health care provider tells you that you are at risk for this type of infection. Your sexual activity has changed since you were last screened, and you are at increased risk for chlamydia or gonorrhea. Ask your health care provider if you are at risk. Ask your health care provider about whether you are at high risk for HIV. Your health care provider may recommend a prescription medicine to help prevent HIV infection. If you choose to take medicine to prevent HIV, you should first get tested for HIV. You should then be tested every 3 months for as long as you are taking the medicine. Follow these instructions at home: Alcohol use Do not drink alcohol if your health care provider tells you not to drink. If you drink alcohol: Limit how much you have to 0-2 drinks a day. Know how much alcohol is in your drink. In the [place] one drink equals one 12 oz bottle of beer (355 mL), one 5 oz glass of wine (148 mL), or one 1 oz glass of hard liquor (44 mL). Lifestyle Do not use any products that contain nicotine or tobacco. These products include cigarettes, chewing tobacco, and vaping devices, such as e-cigarettes. If you need help quitting, ask your health care provider. Do not use street drugs. Do not share needles. Ask your health care provider for help if you need support or information about quitting drugs. General instructions Schedule regular health, dental, and eye exams. Stay current  with your vaccines. Tell your health care provider if: You often feel depressed. You have ever been abused or do not feel safe at home. Summary Adopting a healthy lifestyle and getting preventive care are important in promoting health and wellness. Follow your health care provider's instructions about healthy diet, exercising, and getting tested or screened for diseases. Follow your health care provider's instructions on monitoring your cholesterol and blood pressure. This information is not intended to replace advice given to you by your health care provider. Make sure you discuss any questions you have with your health care provider. Document Revised: 12/17/2020 Document Reviewed: 12/17/2020 Elsevier Patient Education  2024 [employer].

## 2023-11-05 ENCOUNTER — Encounter: Payer: Self-pay | Admitting: Family Medicine

## 2023-11-05 LAB — VITAMIN B12: Vitamin B-12: 280 pg/mL (ref 211–911)

## 2023-11-05 LAB — VITAMIN D 25 HYDROXY (VIT D DEFICIENCY, FRACTURES): VITD: 26.76 ng/mL — ABNORMAL LOW (ref 30.00–100.00)

## 2024-09-13 ENCOUNTER — Other Ambulatory Visit: Payer: Self-pay | Admitting: Family Medicine

## 2024-09-13 DIAGNOSIS — Z8249 Family history of ischemic heart disease and other diseases of the circulatory system: Secondary | ICD-10-CM

## 2024-09-13 DIAGNOSIS — I7090 Unspecified atherosclerosis: Secondary | ICD-10-CM

## 2024-09-26 ENCOUNTER — Other Ambulatory Visit
